# Patient Record
Sex: Female | Born: 1985 | Race: White | Hispanic: No | Marital: Single | Smoking: Current every day smoker
Health system: Southern US, Community
[De-identification: ages and names within clinical notes are randomized; demographics above are authoritative.]

## PROBLEM LIST (undated history)

## (undated) DIAGNOSIS — D689 Coagulation defect, unspecified: Secondary | ICD-10-CM

## (undated) DIAGNOSIS — H9311 Tinnitus, right ear: Secondary | ICD-10-CM

## (undated) DIAGNOSIS — E785 Hyperlipidemia, unspecified: Secondary | ICD-10-CM

## (undated) DIAGNOSIS — IMO0002 Reserved for concepts with insufficient information to code with codable children: Secondary | ICD-10-CM

## (undated) DIAGNOSIS — F419 Anxiety disorder, unspecified: Secondary | ICD-10-CM

## (undated) DIAGNOSIS — F32A Depression, unspecified: Secondary | ICD-10-CM

## (undated) DIAGNOSIS — K219 Gastro-esophageal reflux disease without esophagitis: Secondary | ICD-10-CM

## (undated) HISTORY — DX: Coagulation defect, unspecified: D68.9

## (undated) HISTORY — DX: Gastro-esophageal reflux disease without esophagitis: K21.9

## (undated) HISTORY — DX: Reserved for concepts with insufficient information to code with codable children: IMO0002

## (undated) HISTORY — DX: Hyperlipidemia, unspecified: E78.5

## (undated) HISTORY — DX: Depression, unspecified: F32.A

## (undated) HISTORY — PX: FRACTURE SURGERY: SHX138

## (undated) HISTORY — DX: Tinnitus, right ear: H93.11

## (undated) HISTORY — DX: Anxiety disorder, unspecified: F41.9

---

## 2009-10-14 ENCOUNTER — Emergency Department (HOSPITAL_COMMUNITY): Admission: EM | Admit: 2009-10-14 | Discharge: 2009-10-14 | Payer: Self-pay | Admitting: Emergency Medicine

## 2017-07-14 ENCOUNTER — Inpatient Hospital Stay (HOSPITAL_COMMUNITY)
Admission: EM | Admit: 2017-07-14 | Discharge: 2017-07-23 | DRG: 511 | Disposition: A | Discharge status: Skilled Nursing Facility | Payer: 59 | Attending: General Surgery | Admitting: General Surgery

## 2017-07-14 ENCOUNTER — Emergency Department (HOSPITAL_COMMUNITY): Payer: 59

## 2017-07-14 ENCOUNTER — Encounter (HOSPITAL_COMMUNITY): Payer: Self-pay | Admitting: Emergency Medicine

## 2017-07-14 DIAGNOSIS — S0181XA Laceration without foreign body of other part of head, initial encounter: Secondary | ICD-10-CM | POA: Diagnosis present

## 2017-07-14 DIAGNOSIS — S32451A Displaced transverse fracture of right acetabulum, initial encounter for closed fracture: Secondary | ICD-10-CM | POA: Diagnosis present

## 2017-07-14 DIAGNOSIS — S0121XA Laceration without foreign body of nose, initial encounter: Secondary | ICD-10-CM | POA: Diagnosis present

## 2017-07-14 DIAGNOSIS — S52502A Unspecified fracture of the lower end of left radius, initial encounter for closed fracture: Secondary | ICD-10-CM

## 2017-07-14 DIAGNOSIS — S52552A Other extraarticular fracture of lower end of left radius, initial encounter for closed fracture: Secondary | ICD-10-CM | POA: Diagnosis present

## 2017-07-14 DIAGNOSIS — S32811A Multiple fractures of pelvis with unstable disruption of pelvic ring, initial encounter for closed fracture: Secondary | ICD-10-CM | POA: Diagnosis present

## 2017-07-14 DIAGNOSIS — Z23 Encounter for immunization: Secondary | ICD-10-CM | POA: Diagnosis not present

## 2017-07-14 DIAGNOSIS — S329XXA Fracture of unspecified parts of lumbosacral spine and pelvis, initial encounter for closed fracture: Secondary | ICD-10-CM

## 2017-07-14 DIAGNOSIS — R339 Retention of urine, unspecified: Secondary | ICD-10-CM | POA: Diagnosis not present

## 2017-07-14 DIAGNOSIS — L259 Unspecified contact dermatitis, unspecified cause: Secondary | ICD-10-CM | POA: Diagnosis not present

## 2017-07-14 DIAGNOSIS — S63015A Dislocation of distal radioulnar joint of left wrist, initial encounter: Secondary | ICD-10-CM

## 2017-07-14 DIAGNOSIS — T148XXA Other injury of unspecified body region, initial encounter: Secondary | ICD-10-CM

## 2017-07-14 DIAGNOSIS — S62102A Fracture of unspecified carpal bone, left wrist, initial encounter for closed fracture: Secondary | ICD-10-CM

## 2017-07-14 DIAGNOSIS — S32599A Other specified fracture of unspecified pubis, initial encounter for closed fracture: Secondary | ICD-10-CM

## 2017-07-14 DIAGNOSIS — T1490XA Injury, unspecified, initial encounter: Secondary | ICD-10-CM

## 2017-07-14 HISTORY — DX: Multiple fractures of pelvis with unstable disruption of pelvic ring, initial encounter for closed fracture: S32.811A

## 2017-07-14 LAB — CBC
HCT: 41.7 % (ref 36.0–46.0)
Hemoglobin: 14 g/dL (ref 12.0–15.0)
MCH: 31.3 pg (ref 26.0–34.0)
MCHC: 33.6 g/dL (ref 30.0–36.0)
MCV: 93.1 fL (ref 78.0–100.0)
Platelets: 360 10*3/uL (ref 150–400)
RBC: 4.48 MIL/uL (ref 3.87–5.11)
RDW: 12.2 % (ref 11.5–15.5)
WBC: 16.8 10*3/uL — ABNORMAL HIGH (ref 4.0–10.5)

## 2017-07-14 LAB — BPAM FFP
BLOOD PRODUCT EXPIRATION DATE: 201905252359
Blood Product Expiration Date: 201905252359
ISSUE DATE / TIME: 201905211818
ISSUE DATE / TIME: 201905211818
Unit Type and Rh: 6200
Unit Type and Rh: 6200

## 2017-07-14 LAB — ABO/RH: ABO/RH(D): A POS

## 2017-07-14 LAB — I-STAT CHEM 8, ED
BUN: 16 mg/dL (ref 6–20)
CHLORIDE: 111 mmol/L (ref 101–111)
Calcium, Ion: 1.05 mmol/L — ABNORMAL LOW (ref 1.15–1.40)
Creatinine, Ser: 0.8 mg/dL (ref 0.44–1.00)
Glucose, Bld: 141 mg/dL — ABNORMAL HIGH (ref 65–99)
HEMATOCRIT: 42 % (ref 36.0–46.0)
Hemoglobin: 14.3 g/dL (ref 12.0–15.0)
Potassium: 3.4 mmol/L — ABNORMAL LOW (ref 3.5–5.1)
SODIUM: 142 mmol/L (ref 135–145)
TCO2: 17 mmol/L — ABNORMAL LOW (ref 22–32)

## 2017-07-14 LAB — LACTIC ACID, PLASMA: Lactic Acid, Venous: 1.6 mmol/L (ref 0.5–1.9)

## 2017-07-14 LAB — PREPARE FRESH FROZEN PLASMA: UNIT DIVISION: 0

## 2017-07-14 LAB — COMPREHENSIVE METABOLIC PANEL
ALBUMIN: 3.5 g/dL (ref 3.5–5.0)
ALK PHOS: 50 U/L (ref 38–126)
ALT: 26 U/L (ref 14–54)
AST: 27 U/L (ref 15–41)
Anion gap: 9 (ref 5–15)
BILIRUBIN TOTAL: 0.6 mg/dL (ref 0.3–1.2)
BUN: 14 mg/dL (ref 6–20)
CALCIUM: 8.1 mg/dL — AB (ref 8.9–10.3)
CO2: 16 mmol/L — ABNORMAL LOW (ref 22–32)
Chloride: 114 mmol/L — ABNORMAL HIGH (ref 101–111)
Creatinine, Ser: 1.03 mg/dL — ABNORMAL HIGH (ref 0.44–1.00)
GFR calc Af Amer: >60 mL/min (ref >60)
GFR calc non Af Amer: >60 mL/min (ref >60)
Glucose, Bld: 147 mg/dL — ABNORMAL HIGH (ref 65–99)
Potassium: 3.4 mmol/L — ABNORMAL LOW (ref 3.5–5.1)
Sodium: 139 mmol/L (ref 135–145)
TOTAL PROTEIN: 6 g/dL — AB (ref 6.5–8.1)

## 2017-07-14 LAB — I-STAT CG4 LACTIC ACID, ED: LACTIC ACID, VENOUS: 3.32 mmol/L — AB (ref 0.5–1.9)

## 2017-07-14 LAB — ETHANOL

## 2017-07-14 LAB — CDS SEROLOGY

## 2017-07-14 LAB — PROTIME-INR
INR: 1.11
Prothrombin Time: 14.2 seconds (ref 11.4–15.2)

## 2017-07-14 LAB — MRSA PCR SCREENING: MRSA by PCR: NEGATIVE

## 2017-07-14 MED ORDER — MORPHINE SULFATE (PF) 4 MG/ML IV SOLN
4.0000 mg | INTRAVENOUS | Status: DC | PRN
  Administered 2017-07-14 – 2017-07-15 (×6): 4 mg via INTRAVENOUS
  Filled 2017-07-14 (×6): qty 1

## 2017-07-14 MED ORDER — LIDOCAINE-EPINEPHRINE (PF) 2 %-1:200000 IJ SOLN
10.0000 mL | Freq: Once | INTRAMUSCULAR | Status: AC
  Administered 2017-07-14: 10 mL via INTRADERMAL
  Filled 2017-07-14: qty 20

## 2017-07-14 MED ORDER — MORPHINE SULFATE (PF) 4 MG/ML IV SOLN
2.0000 mg | INTRAVENOUS | Status: DC | PRN
  Administered 2017-07-15: 2 mg via INTRAVENOUS
  Filled 2017-07-14: qty 1

## 2017-07-14 MED ORDER — FENTANYL CITRATE (PF) 100 MCG/2ML IJ SOLN
50.0000 ug | Freq: Once | INTRAMUSCULAR | Status: AC
  Administered 2017-07-14: 50 ug via INTRAVENOUS

## 2017-07-14 MED ORDER — TETANUS-DIPHTH-ACELL PERTUSSIS 5-2.5-18.5 LF-MCG/0.5 IM SUSP
0.5000 mL | Freq: Once | INTRAMUSCULAR | Status: AC
  Administered 2017-07-14: 0.5 mL via INTRAMUSCULAR
  Filled 2017-07-14: qty 0.5

## 2017-07-14 MED ORDER — ONDANSETRON 4 MG PO TBDP: 4.0000 mg | ORAL_TABLET | Freq: Four times a day (QID) | ORAL | Status: DC | PRN

## 2017-07-14 MED ORDER — ONDANSETRON HCL 4 MG/2ML IJ SOLN: 4.0000 mg | Freq: Four times a day (QID) | INTRAMUSCULAR | Status: DC | PRN

## 2017-07-14 MED ORDER — POTASSIUM CHLORIDE IN NACL 20-0.9 MEQ/L-% IV SOLN
INTRAVENOUS | Status: DC
  Administered 2017-07-14 – 2017-07-16 (×4): via INTRAVENOUS
  Filled 2017-07-14 (×5): qty 1000

## 2017-07-14 MED ORDER — IOHEXOL 300 MG/ML  SOLN
100.0000 mL | Freq: Once | INTRAMUSCULAR | Status: AC | PRN
  Administered 2017-07-14: 100 mL via INTRAVENOUS

## 2017-07-14 MED ORDER — LIDOCAINE HCL (PF) 1 % IJ SOLN
INTRAMUSCULAR | Status: AC
Start: 2017-07-14 — End: 2017-07-15
  Filled 2017-07-14: qty 30

## 2017-07-14 MED ORDER — LIDOCAINE HCL (PF) 1 % IJ SOLN
INTRAMUSCULAR | Status: AC
  Filled 2017-07-14: qty 30

## 2017-07-14 MED ORDER — FENTANYL CITRATE (PF) 100 MCG/2ML IJ SOLN
50.0000 ug | Freq: Once | INTRAMUSCULAR | Status: AC
  Administered 2017-07-14: 50 ug via INTRAVENOUS
  Filled 2017-07-14: qty 2

## 2017-07-14 MED ORDER — FENTANYL CITRATE (PF) 100 MCG/2ML IJ SOLN
INTRAMUSCULAR | Status: AC
  Filled 2017-07-14: qty 2
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
## 2017-07-14 NOTE — Progress Notes (Signed)
   07/14/17 1800  Clinical Encounter Type  Visited With Patient;Health care provider  Visit Type ED  Referral From Nurse  Consult/Referral To Chaplain   Responded to a Level II that was upgraded to Level I.  EMT indicated patient's sister had been contacted and she was calling the patient's mother.  Patient being evaluated and then to CT.  Let the nurse first know family on the way.  Will follow and support. Chaplain Agustin Cree
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
## 2017-07-14 NOTE — ED Notes (Signed)
Patient transported to CT 

## 2017-07-15 ENCOUNTER — Inpatient Hospital Stay (HOSPITAL_COMMUNITY): Payer: 59 | Admitting: Certified Registered"

## 2017-07-15 ENCOUNTER — Inpatient Hospital Stay (HOSPITAL_COMMUNITY): Payer: 59

## 2017-07-15 ENCOUNTER — Encounter (HOSPITAL_COMMUNITY): Admission: EM | Disposition: A | Discharge status: Skilled Nursing Facility | Payer: Self-pay | Source: Home / Self Care

## 2017-07-15 ENCOUNTER — Encounter (HOSPITAL_COMMUNITY): Payer: Self-pay

## 2017-07-15 ENCOUNTER — Other Ambulatory Visit: Payer: Self-pay

## 2017-07-15 HISTORY — PX: ORIF PELVIC FRACTURE: SHX2128

## 2017-07-15 HISTORY — PX: ORIF WRIST FRACTURE: SHX2133

## 2017-07-15 LAB — COMPREHENSIVE METABOLIC PANEL
ALBUMIN: 3.5 g/dL (ref 3.5–5.0)
ALK PHOS: 48 U/L (ref 38–126)
ALT: 29 U/L (ref 14–54)
ANION GAP: 10 (ref 5–15)
AST: 28 U/L (ref 15–41)
BUN: 12 mg/dL (ref 6–20)
CHLORIDE: 112 mmol/L — AB (ref 101–111)
CO2: 19 mmol/L — ABNORMAL LOW (ref 22–32)
CREATININE: 0.78 mg/dL (ref 0.44–1.00)
Calcium: 8.2 mg/dL — ABNORMAL LOW (ref 8.9–10.3)
GFR calc Af Amer: >60 mL/min (ref >60)
GFR calc non Af Amer: >60 mL/min (ref >60)
Glucose, Bld: 107 mg/dL — ABNORMAL HIGH (ref 65–99)
Potassium: 3.8 mmol/L (ref 3.5–5.1)
SODIUM: 141 mmol/L (ref 135–145)
Total Bilirubin: 0.6 mg/dL (ref 0.3–1.2)
Total Protein: 5.9 g/dL — ABNORMAL LOW (ref 6.5–8.1)

## 2017-07-15 LAB — BPAM RBC
BLOOD PRODUCT EXPIRATION DATE: 201906022359
Blood Product Expiration Date: 201906012359
ISSUE DATE / TIME: 201905220752
ISSUE DATE / TIME: 201905220752
UNIT TYPE AND RH: 9500
Unit Type and Rh: 9500

## 2017-07-15 LAB — HIV ANTIBODY (ROUTINE TESTING W REFLEX): HIV SCREEN 4TH GENERATION: NONREACTIVE

## 2017-07-15 LAB — PREGNANCY, URINE: PREG TEST UR: NEGATIVE

## 2017-07-15 LAB — CBC
HCT: 40.3 % (ref 36.0–46.0)
HEMOGLOBIN: 13.4 g/dL (ref 12.0–15.0)
MCH: 31.2 pg (ref 26.0–34.0)
MCHC: 33.3 g/dL (ref 30.0–36.0)
MCV: 93.7 fL (ref 78.0–100.0)
Platelets: 312 10*3/uL (ref 150–400)
RBC: 4.3 MIL/uL (ref 3.87–5.11)
RDW: 12.6 % (ref 11.5–15.5)
WBC: 14.8 10*3/uL — ABNORMAL HIGH (ref 4.0–10.5)

## 2017-07-15 LAB — TYPE AND SCREEN
ABO/RH(D): A POS
ABO/RH(D): A POS
ANTIBODY SCREEN: NEGATIVE
ANTIBODY SCREEN: NEGATIVE
UNIT DIVISION: 0
Unit division: 0

## 2017-07-15 LAB — BLOOD PRODUCT ORDER (VERBAL) VERIFICATION

## 2017-07-15 SURGERY — OPEN REDUCTION INTERNAL FIXATION (ORIF) PELVIC FRACTURE
Anesthesia: General | Site: Wrist

## 2017-07-15 MED ORDER — DEXAMETHASONE SODIUM PHOSPHATE 10 MG/ML IJ SOLN
INTRAMUSCULAR | Status: AC
  Filled 2017-07-15: qty 1

## 2017-07-15 MED ORDER — LIDOCAINE 2% (20 MG/ML) 5 ML SYRINGE
INTRAMUSCULAR | Status: DC | PRN
  Administered 2017-07-15: 80 mg via INTRAVENOUS

## 2017-07-15 MED ORDER — PROPOFOL 10 MG/ML IV BOLUS
INTRAVENOUS | Status: AC
  Filled 2017-07-15: qty 20

## 2017-07-15 MED ORDER — OXYCODONE HCL 5 MG/5ML PO SOLN: 5.0000 mg | Freq: Once | ORAL | Status: AC | PRN

## 2017-07-15 MED ORDER — MIDAZOLAM HCL 5 MG/5ML IJ SOLN
INTRAMUSCULAR | Status: DC | PRN
  Administered 2017-07-15: 2 mg via INTRAVENOUS

## 2017-07-15 MED ORDER — OXYCODONE HCL 5 MG PO TABS
5.0000 mg | ORAL_TABLET | Freq: Once | ORAL | Status: AC | PRN
  Administered 2017-07-15: 5 mg via ORAL

## 2017-07-15 MED ORDER — LACTATED RINGERS IV SOLN
INTRAVENOUS | Status: DC
  Administered 2017-07-15 (×3): via INTRAVENOUS

## 2017-07-15 MED ORDER — ROCURONIUM BROMIDE 10 MG/ML (PF) SYRINGE
PREFILLED_SYRINGE | INTRAVENOUS | Status: AC
  Filled 2017-07-15: qty 5

## 2017-07-15 MED ORDER — BACITRACIN ZINC 500 UNIT/GM EX OINT
TOPICAL_OINTMENT | CUTANEOUS | Status: AC
  Filled 2017-07-15: qty 28.35

## 2017-07-15 MED ORDER — HYDROMORPHONE HCL 1 MG/ML IJ SOLN
INTRAMUSCULAR | Status: DC | PRN
  Administered 2017-07-15: 0.5 mg via INTRAVENOUS

## 2017-07-15 MED ORDER — TRANEXAMIC ACID 1000 MG/10ML IV SOLN
2000.0000 mg | INTRAVENOUS | Status: DC
  Filled 2017-07-15: qty 20

## 2017-07-15 MED ORDER — HYDROMORPHONE HCL 1 MG/ML IJ SOLN
1.0000 mg | INTRAMUSCULAR | Status: DC | PRN
  Administered 2017-07-15 – 2017-07-16 (×4): 1 mg via INTRAVENOUS
  Filled 2017-07-15 (×4): qty 1

## 2017-07-15 MED ORDER — VANCOMYCIN HCL 1000 MG IV SOLR
INTRAVENOUS | Status: AC
  Filled 2017-07-15: qty 2000

## 2017-07-15 MED ORDER — VANCOMYCIN HCL 1000 MG IV SOLR
INTRAVENOUS | Status: DC | PRN
Start: 2017-07-15 — End: 2017-07-15
  Administered 2017-07-15 (×2): 1000 mg

## 2017-07-15 MED ORDER — TOBRAMYCIN SULFATE 1.2 G IJ SOLR
INTRAMUSCULAR | Status: AC
  Filled 2017-07-15: qty 1.2

## 2017-07-15 MED ORDER — MIDAZOLAM HCL 2 MG/2ML IJ SOLN
INTRAMUSCULAR | Status: AC
  Filled 2017-07-15: qty 2

## 2017-07-15 MED ORDER — CEFAZOLIN SODIUM-DEXTROSE 2-4 GM/100ML-% IV SOLN
INTRAVENOUS | Status: AC
  Filled 2017-07-15: qty 100

## 2017-07-15 MED ORDER — MEPERIDINE HCL 50 MG/ML IJ SOLN: 6.2500 mg | INTRAMUSCULAR | Status: DC | PRN

## 2017-07-15 MED ORDER — CEFAZOLIN SODIUM-DEXTROSE 2-3 GM-%(50ML) IV SOLR
INTRAVENOUS | Status: DC | PRN
  Administered 2017-07-15 (×2): 2 g via INTRAVENOUS

## 2017-07-15 MED ORDER — SUGAMMADEX SODIUM 200 MG/2ML IV SOLN
INTRAVENOUS | Status: DC | PRN
  Administered 2017-07-15: 200 mg via INTRAVENOUS

## 2017-07-15 MED ORDER — 0.9 % SODIUM CHLORIDE (POUR BTL) OPTIME
TOPICAL | Status: DC | PRN
  Administered 2017-07-15 (×2): 1000 mL

## 2017-07-15 MED ORDER — PROMETHAZINE HCL 25 MG/ML IJ SOLN: 6.2500 mg | INTRAMUSCULAR | Status: DC | PRN

## 2017-07-15 MED ORDER — LIDOCAINE 2% (20 MG/ML) 5 ML SYRINGE
INTRAMUSCULAR | Status: AC
  Filled 2017-07-15: qty 5

## 2017-07-15 MED ORDER — OXYCODONE HCL 5 MG PO TABS
ORAL_TABLET | ORAL | Status: AC
  Filled 2017-07-15: qty 1

## 2017-07-15 MED ORDER — ONDANSETRON HCL 4 MG/2ML IJ SOLN
INTRAMUSCULAR | Status: DC | PRN
  Administered 2017-07-15: 4 mg via INTRAVENOUS

## 2017-07-15 MED ORDER — FENTANYL CITRATE (PF) 250 MCG/5ML IJ SOLN
INTRAMUSCULAR | Status: AC
  Filled 2017-07-15: qty 5

## 2017-07-15 MED ORDER — HYDROMORPHONE HCL 1 MG/ML IJ SOLN
INTRAMUSCULAR | Status: AC
  Filled 2017-07-15: qty 0.5

## 2017-07-15 MED ORDER — ROCURONIUM BROMIDE 10 MG/ML (PF) SYRINGE
PREFILLED_SYRINGE | INTRAVENOUS | Status: DC | PRN
  Administered 2017-07-15 (×2): 20 mg via INTRAVENOUS
  Administered 2017-07-15: 30 mg via INTRAVENOUS
  Administered 2017-07-15: 50 mg via INTRAVENOUS
  Administered 2017-07-15 (×2): 20 mg via INTRAVENOUS

## 2017-07-15 MED ORDER — HYDROMORPHONE HCL 2 MG/ML IJ SOLN
0.2500 mg | INTRAMUSCULAR | Status: DC | PRN
  Administered 2017-07-15: 0.3 mg via INTRAVENOUS
  Administered 2017-07-15: 0.5 mg via INTRAVENOUS

## 2017-07-15 MED ORDER — BACITRACIN ZINC 500 UNIT/GM EX OINT
TOPICAL_OINTMENT | CUTANEOUS | Status: DC | PRN
  Administered 2017-07-15: 2 via TOPICAL

## 2017-07-15 MED ORDER — FENTANYL CITRATE (PF) 100 MCG/2ML IJ SOLN
INTRAMUSCULAR | Status: DC | PRN
  Administered 2017-07-15: 100 ug via INTRAVENOUS
  Administered 2017-07-15 (×8): 50 ug via INTRAVENOUS

## 2017-07-15 MED ORDER — PROPOFOL 10 MG/ML IV BOLUS
INTRAVENOUS | Status: DC | PRN
  Administered 2017-07-15: 150 mg via INTRAVENOUS

## 2017-07-15 MED ORDER — CEFAZOLIN SODIUM-DEXTROSE 2-4 GM/100ML-% IV SOLN
2.0000 g | Freq: Three times a day (TID) | INTRAVENOUS | Status: AC
  Administered 2017-07-15 – 2017-07-16 (×3): 2 g via INTRAVENOUS
  Filled 2017-07-15 (×4): qty 100

## 2017-07-15 MED ORDER — TOBRAMYCIN SULFATE 1.2 G IJ SOLR
INTRAMUSCULAR | Status: DC | PRN
  Administered 2017-07-15: 1.2 g

## 2017-07-15 MED ORDER — DEXAMETHASONE SODIUM PHOSPHATE 10 MG/ML IJ SOLN
INTRAMUSCULAR | Status: DC | PRN
  Administered 2017-07-15: 10 mg via INTRAVENOUS

## 2017-07-15 MED ORDER — HYDROMORPHONE HCL 2 MG/ML IJ SOLN
INTRAMUSCULAR | Status: AC
  Filled 2017-07-15: qty 1

## 2017-07-15 MED ORDER — METHOCARBAMOL 1000 MG/10ML IJ SOLN
500.0000 mg | Freq: Three times a day (TID) | INTRAVENOUS | Status: DC | PRN
  Filled 2017-07-15 (×2): qty 5

## 2017-07-15 MED ORDER — TOBRAMYCIN SULFATE 80 MG/2ML IJ SOLN
INTRAMUSCULAR | Status: AC
  Filled 2017-07-15: qty 2

## 2017-07-15 MED ORDER — OXYCODONE HCL 5 MG PO TABS
5.0000 mg | ORAL_TABLET | Freq: Four times a day (QID) | ORAL | Status: DC | PRN
  Administered 2017-07-15 – 2017-07-16 (×2): 5 mg via ORAL
  Filled 2017-07-15 (×2): qty 1

## 2017-07-15 SURGICAL SUPPLY — 104 items
BANDAGE ACE 4X5 VEL STRL LF (GAUZE/BANDAGES/DRESSINGS) ×4 IMPLANT
BIT DRILL CANN 4.5MM (BIT) ×4 IMPLANT
BLADE CLIPPER SURG (BLADE) ×4 IMPLANT
BNDG COHESIVE 4X5 TAN STRL (GAUZE/BANDAGES/DRESSINGS) ×4 IMPLANT
BNDG ESMARK 4X9 LF (GAUZE/BANDAGES/DRESSINGS) ×8 IMPLANT
BRUSH SCRUB SURG 4.25 DISP (MISCELLANEOUS) ×8 IMPLANT
CHLORAPREP W/TINT 26ML (MISCELLANEOUS) ×8 IMPLANT
COVER SURGICAL LIGHT HANDLE (MISCELLANEOUS) ×4 IMPLANT
CUFF TOURNIQUET SINGLE 18IN (TOURNIQUET CUFF) ×4 IMPLANT
DECANTER SPIKE VIAL GLASS SM (MISCELLANEOUS) IMPLANT
DERMABOND ADHESIVE PROPEN (GAUZE/BANDAGES/DRESSINGS) ×2
DERMABOND ADVANCED (GAUZE/BANDAGES/DRESSINGS) ×4
DERMABOND ADVANCED .7 DNX12 (GAUZE/BANDAGES/DRESSINGS) ×4 IMPLANT
DERMABOND ADVANCED .7 DNX6 (GAUZE/BANDAGES/DRESSINGS) ×2 IMPLANT
DISTAL RADIUS HEAD 6H 3HOLE SH (Orthopedic Implant) ×4 IMPLANT
DRAIN CHANNEL 15F RND FF W/TCR (WOUND CARE) IMPLANT
DRAPE C-ARM 42X72 X-RAY (DRAPES) ×8 IMPLANT
DRAPE C-ARMOR (DRAPES) ×4 IMPLANT
DRAPE HALF SHEET 40X57 (DRAPES) ×4 IMPLANT
DRAPE IMP U-DRAPE 54X76 (DRAPES) ×4 IMPLANT
DRAPE INCISE IOBAN 66X45 STRL (DRAPES) ×12 IMPLANT
DRAPE ORTHO SPLIT 77X108 STRL (DRAPES) ×2
DRAPE PROXIMA HALF (DRAPES) ×8 IMPLANT
DRAPE SURG 17X23 STRL (DRAPES) ×24 IMPLANT
DRAPE SURG ORHT 6 SPLT 77X108 (DRAPES) ×2 IMPLANT
DRAPE U-SHAPE 47X51 STRL (DRAPES) ×4 IMPLANT
DRAPE UNIVERSAL PACK (DRAPES) ×4 IMPLANT
DRILL BIT CANN 4.5MM (BIT) ×4
DRSG EMULSION OIL 3X3 NADH (GAUZE/BANDAGES/DRESSINGS) ×4 IMPLANT
DRSG MEPILEX BORDER 4X4 (GAUZE/BANDAGES/DRESSINGS) ×12 IMPLANT
DRSG MEPILEX BORDER 4X8 (GAUZE/BANDAGES/DRESSINGS) ×4 IMPLANT
DRSG PAD ABDOMINAL 8X10 ST (GAUZE/BANDAGES/DRESSINGS) ×4 IMPLANT
ELECT PENCIL ROCKER SW 15FT (MISCELLANEOUS) ×4 IMPLANT
ELECT REM PT RETURN 9FT ADLT (ELECTROSURGICAL) ×4
ELECTRODE REM PT RTRN 9FT ADLT (ELECTROSURGICAL) ×2 IMPLANT
EVACUATOR SILICONE 100CC (DRAIN) IMPLANT
GAUZE SPONGE 4X4 12PLY STRL (GAUZE/BANDAGES/DRESSINGS) ×4 IMPLANT
GLOVE BIO SURGEON STRL SZ7.5 (GLOVE) ×16 IMPLANT
GLOVE BIO SURGEON STRL SZ8 (GLOVE) ×4 IMPLANT
GLOVE BIOGEL PI IND STRL 7.5 (GLOVE) ×2 IMPLANT
GLOVE BIOGEL PI IND STRL 8 (GLOVE) ×2 IMPLANT
GLOVE BIOGEL PI INDICATOR 7.5 (GLOVE) ×2
GLOVE BIOGEL PI INDICATOR 8 (GLOVE) ×2
GOWN STRL REUS W/ TWL LRG LVL3 (GOWN DISPOSABLE) ×4 IMPLANT
GOWN STRL REUS W/ TWL XL LVL3 (GOWN DISPOSABLE) ×2 IMPLANT
GOWN STRL REUS W/TWL LRG LVL3 (GOWN DISPOSABLE) ×4
GOWN STRL REUS W/TWL XL LVL3 (GOWN DISPOSABLE) ×2
GUIDEWIRE 2.0MM (WIRE) ×16 IMPLANT
GUIDEWIRE THREADED 2.8MM (WIRE) ×12 IMPLANT
HEAD RADIUS DISTAL 3HOLE SH 6H (Orthopedic Implant) ×2 IMPLANT
KIT BASIN OR (CUSTOM PROCEDURE TRAY) ×4 IMPLANT
KIT TURNOVER KIT B (KITS) ×4 IMPLANT
MANIFOLD NEPTUNE II (INSTRUMENTS) ×4 IMPLANT
NEEDLE HYPO 25GX1X1/2 BEV (NEEDLE) IMPLANT
NS IRRIG 1000ML POUR BTL (IV SOLUTION) ×8 IMPLANT
PACK ORTHO EXTREMITY (CUSTOM PROCEDURE TRAY) ×4 IMPLANT
PACK TOTAL JOINT (CUSTOM PROCEDURE TRAY) ×4 IMPLANT
PAD ARMBOARD 7.5X6 YLW CONV (MISCELLANEOUS) ×8 IMPLANT
PAD CAST 3X4 CTTN HI CHSV (CAST SUPPLIES) ×2 IMPLANT
PAD CAST 4YDX4 CTTN HI CHSV (CAST SUPPLIES) ×2 IMPLANT
PADDING CAST COTTON 3X4 STRL (CAST SUPPLIES) ×2
PADDING CAST COTTON 4X4 STRL (CAST SUPPLIES) ×2
PLATE PUBLIC SYMPHOSIS 3.5 (Plate) ×4 IMPLANT
SCREW  LOCK CANN 6.5X130 (Screw) ×2 IMPLANT
SCREW BONE CANN 7.3X75 HEX (Screw) ×4 IMPLANT
SCREW CANN 6.5X90 (Screw) ×4 IMPLANT
SCREW CANN 6.5X90 32MM THD (Screw) ×2 IMPLANT
SCREW CORTEX 2.4X18 (Screw) ×4 IMPLANT
SCREW CORTEX 2.7X14MM (Screw) ×4 IMPLANT
SCREW CORTEX 3.5 18MM (Screw) ×2 IMPLANT
SCREW CORTEX 3.5 22MM (Screw) ×4 IMPLANT
SCREW CORTEX 3.5 30MM (Screw) ×2 IMPLANT
SCREW CORTEX 3.5X45MM (Screw) ×4 IMPLANT
SCREW CORTEX 3.5X50MM (Screw) ×2 IMPLANT
SCREW LOCK CANN 6.5X130 (Screw) ×2 IMPLANT
SCREW LOCK CORT ST 3.5X18 (Screw) ×2 IMPLANT
SCREW LOCK CORT ST 3.5X22 (Screw) ×4 IMPLANT
SCREW LOCK CORT ST 3.5X30 (Screw) ×2 IMPLANT
SCREW PELVIC CORT ST 3.5X50 (Screw) ×2 IMPLANT
SCREW SELF TAP 12M (Screw) ×8 IMPLANT
SCREW VA LOCKING 2.4X16 (Screw) ×8 IMPLANT
SCREW VA LOCKING 2.4X18 (Screw) ×12 IMPLANT
SLING ARM FOAM STRAP LRG (SOFTGOODS) ×4 IMPLANT
SPONGE LAP 18X18 X RAY DECT (DISPOSABLE) ×4 IMPLANT
STAPLER VISISTAT 35W (STAPLE) ×4 IMPLANT
SUCTION FRAZIER HANDLE 10FR (MISCELLANEOUS) ×2
SUCTION TUBE FRAZIER 10FR DISP (MISCELLANEOUS) ×2 IMPLANT
SUT ETHILON 3 0 PS 1 (SUTURE) ×12 IMPLANT
SUT MNCRL AB 3-0 PS2 18 (SUTURE) ×4 IMPLANT
SUT MON AB 2-0 CT1 36 (SUTURE) ×4 IMPLANT
SUT VIC AB 0 CT1 27 (SUTURE) ×4
SUT VIC AB 0 CT1 27XBRD ANBCTR (SUTURE) ×4 IMPLANT
SUT VIC AB 1 CT1 18XCR BRD 8 (SUTURE) IMPLANT
SUT VIC AB 1 CT1 8-18 (SUTURE)
SUT VIC AB 2-0 CT1 27 (SUTURE) ×6
SUT VIC AB 2-0 CT1 TAPERPNT 27 (SUTURE) ×6 IMPLANT
SYR CONTROL 10ML LL (SYRINGE) IMPLANT
TOWEL OR 17X24 6PK STRL BLUE (TOWEL DISPOSABLE) IMPLANT
TOWEL OR 17X26 10 PK STRL BLUE (TOWEL DISPOSABLE) ×4 IMPLANT
TUBE CONNECTING 12'X1/4 (SUCTIONS) ×2
TUBE CONNECTING 12X1/4 (SUCTIONS) ×6 IMPLANT
UNDERPAD 30X30 (UNDERPADS AND DIAPERS) ×4 IMPLANT
WASHER FOR 5.0 SCREWS (Washer) ×8 IMPLANT
WATER STERILE IRR 1000ML POUR (IV SOLUTION) ×16 IMPLANT

## 2017-07-15 NOTE — Anesthesia Preprocedure Evaluation (Signed)
Anesthesia Evaluation  Patient identified by MRN, date of birth, ID band Patient awake    Reviewed: Allergy & Precautions, NPO status , Patient's Chart, lab work & pertinent test results  Airway Mallampati: II  TM Distance: >3 FB Neck ROM: Full    Dental no notable dental hx.    Pulmonary neg pulmonary ROS,    Pulmonary exam normal breath sounds clear to auscultation       Cardiovascular negative cardio ROS Normal cardiovascular exam Rhythm:Regular Rate:Normal     Neuro/Psych negative neurological ROS  negative psych ROS   GI/Hepatic negative GI ROS, Neg liver ROS,   Endo/Other  negative endocrine ROS  Renal/GU negative Renal ROS  negative genitourinary   Musculoskeletal negative musculoskeletal ROS (+)   Abdominal (+) + obese,   Peds negative pediatric ROS (+)  Hematology negative hematology ROS (+)   Anesthesia Other Findings   Reproductive/Obstetrics negative OB ROS                             Anesthesia Physical Anesthesia Plan  ASA: II  Anesthesia Plan: General   Post-op Pain Management:    Induction: Intravenous  PONV Risk Score and Plan: 3 and Ondansetron, Dexamethasone and Midazolam  Airway Management Planned: Oral ETT  Additional Equipment:   Intra-op Plan:   Post-operative Plan: Extubation in OR  Informed Consent: I have reviewed the patients History and Physical, chart, labs and discussed the procedure including the risks, benefits and alternatives for the proposed anesthesia with the patient or authorized representative who has indicated his/her understanding and acceptance.   Dental advisory given  Plan Discussed with: CRNA  Anesthesia Plan Comments:         Anesthesia Quick Evaluation  

## 2017-07-15 NOTE — Anesthesia Postprocedure Evaluation (Signed)
Anesthesia Post Note  Patient: Lynn Scott  Procedure(s) Performed: OPEN REDUCTION INTERNAL FIXATION (ORIF) pubic symphysis SI screw (N/A Pelvis) OPEN REDUCTION INTERNAL FIXATION (ORIF) WRIST FRACTURE (Left Wrist)     Patient location during evaluation: PACU Anesthesia Type: General Level of consciousness: awake and alert Pain management: pain level controlled Vital Signs Assessment: post-procedure vital signs reviewed and stable Respiratory status: spontaneous breathing, nonlabored ventilation, respiratory function stable and patient connected to nasal cannula oxygen Cardiovascular status: blood pressure returned to baseline and stable Postop Assessment: no apparent nausea or vomiting Anesthetic complications: no    Last Vitals:  Vitals:   07/15/17 1817 07/15/17 1830  BP: (!) 128/91 (!) 142/86  Pulse: 69 77  Resp: 19 (!) 42  Temp: 36.6 C 37.2 C  SpO2: 96% 96%    Last Pain:  Vitals:   07/15/17 1830  TempSrc: Oral  PainSc:                  Kay Ricciuti COKER
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
## 2017-07-15 NOTE — Progress Notes (Signed)
Discussed case with Dr. Carola Frost will plan to proceed with with ORIF of pelvis and discussed with Dr. Amanda Pea and will proceed with ORIF of distal radius as well. Risks and benefits discussed with patient. Risks discussed included bleeding requiring blood transfusion, bleeding causing a hematoma, infection, malunion, nonunion, damage to surrounding nerves and blood vessels, pain, hardware prominence or irritation, hardware failure, stiffness, post-traumatic arthritis, DVT/PE, compartment syndrome, and even death. Consent was obtained.  Roby Lofts, MD [facility] [phone] (phone)

## 2017-07-15 NOTE — Progress Notes (Signed)
[facility] ([facility])    Procedure(s) (LRB): OPEN REDUCTION INTERNAL FIXATION (ORIF) pubic symphysis SI screw (N/A) OPEN REDUCTION INTERNAL FIXATION (ORIF) WRIST FRACTURE (Left)  Subjective: Patient reports pain as moderate and and denies numbness or tingling in left hand and both feet.    Objective: Current Vitals Blood pressure (!) 141/77, pulse 64, temperature 99 F (37.2 C), temperature source Oral, resp. rate 18, height  (1.676 m), weight 90.7 kg (200 lb), SpO2 97 %. Vital signs in last 24 hours: Temp:  [97.8 F (36.6 C)-99 F (37.2 C)] 99 F (37.2 C) (05/22 0450) Pulse Rate:  [52-88] 64 (05/22 0600) Resp:  [15-27] 18 (05/22 0600) BP: (91-141)/(48-90) 141/77 (05/22 0600) SpO2:  [96 %-100 %] 97 % (05/22 0600) Weight:  [90.7 kg (200 lb)] 90.7 kg (200 lb) (05/21 2045)  Intake/Output from previous day: 05/21 0701 - 05/22 0700 In: 3500 [I.V.:3500] Out: 800 [Urine:800]  LABS Recent Labs    07/14/17 1832 07/14/17 1837 07/15/17 0159  HGB 14.0 14.3 13.4   Recent Labs    07/14/17 1832 07/14/17 1837 07/15/17 0159  WBC 16.8*  --  14.8*  RBC 4.48  --  4.30  HCT 41.7 42.0 40.3  PLT 360  --  312   Recent Labs    07/14/17 1832 07/14/17 1837 07/15/17 0159  NA 139 142 141  K 3.4* 3.4* 3.8  CL 114* 111 112*  CO2 16*  --  19*  BUN CREATININE 1.03* 0.80 0.78  GLUCOSE 147* 141* 107*  CALCIUM 8.1*  --  8.2*   Recent Labs    07/14/17 1832  INR 1.11     Physical Exam LUE Splint in place  Sens  Ax/R/M/U intact without paresthesia  Mot   Ax/ R/ PIN/ M/ AIN/ U intact  Brisk CR Binder in place R&LLE    No pain with axial loading of LLE  Edema/ swelling controlled  Sens: DPN, SPN, TN intact  Motor: EHL, FHL, and lessor toe ext and flex all intact grossly  DP 2+, Brisk cap refill, warm to touch  Assessment/Plan: Lactate has normalized No carpal tunnel symptoms  Today's planned Procedures with Dr. Jena Gauss (and Gramig):  OPEN  REDUCTION INTERNAL FIXATION (ORIF) pubic symphysis SI screw fixation  OPEN REDUCTION INTERNAL FIXATION (ORIF) WRIST FRACTURE (Left)  Myrene Galas, MD [facility], [facility] [phone]

## 2017-07-15 NOTE — Transfer of Care (Signed)
Immediate Anesthesia Transfer of Care Note  Patient: Lynn Scott  Procedure(s) Performed: OPEN REDUCTION INTERNAL FIXATION (ORIF) pubic symphysis SI screw (N/A Pelvis) OPEN REDUCTION INTERNAL FIXATION (ORIF) WRIST FRACTURE (Left Wrist)  Patient Location: PACU  Anesthesia Type:General  Level of Consciousness: awake, alert  and oriented  Airway & Oxygen Therapy: Patient Spontanous Breathing  Post-op Assessment: Report given to RN and Post -op Vital signs reviewed and stable  Post vital signs: Reviewed and stable  Last Vitals:  Vitals Value Taken Time  BP 127/92 07/15/2017  4:46 PM  Temp    Pulse 100 07/15/2017  4:47 PM  Resp 26 07/15/2017  4:47 PM  SpO2 96 % 07/15/2017  4:47 PM  Vitals shown include unvalidated device data.  Last Pain:  Vitals:   07/15/17 0831  TempSrc: Oral  PainSc:          Complications: No apparent anesthesia complications

## 2017-07-15 NOTE — Anesthesia Procedure Notes (Signed)
Procedure Name: Intubation Date/Time: 07/15/2017 12:13 PM Performed by: Freddie Breech, CRNA Pre-anesthesia Checklist: Timeout performed, Patient being monitored, Suction available, Emergency Drugs available and Patient identified Patient Re-evaluated:Patient Re-evaluated prior to induction Oxygen Delivery Method: Circle system utilized Preoxygenation: Pre-oxygenation with 100% oxygen Induction Type: IV induction Ventilation: Mask ventilation without difficulty and Oral airway inserted - appropriate to patient size Laryngoscope Size: Mac and 3 Grade View: Grade I Tube type: Oral Tube size: 7.0 mm Number of attempts: 1 Placement Confirmation: breath sounds checked- equal and bilateral,  positive ETCO2 and ETT inserted through vocal cords under direct vision Secured at: 21 cm Tube secured with: Tape Dental Injury: Teeth and Oropharynx as per pre-operative assessment

## 2017-07-15 NOTE — Progress Notes (Signed)
Notified Dr Sheliah Hatch regarding patient uncontrolled pain. Orders received.
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
## 2017-07-16 LAB — BASIC METABOLIC PANEL
Anion gap: 9 (ref 5–15)
BUN: 5 mg/dL — AB (ref 6–20)
CALCIUM: 8.2 mg/dL — AB (ref 8.9–10.3)
CO2: 22 mmol/L (ref 22–32)
CREATININE: 0.67 mg/dL (ref 0.44–1.00)
Chloride: 107 mmol/L (ref 101–111)
GFR calc Af Amer: >60 mL/min (ref >60)
Glucose, Bld: 116 mg/dL — ABNORMAL HIGH (ref 65–99)
Potassium: 4.5 mmol/L (ref 3.5–5.1)
Sodium: 138 mmol/L (ref 135–145)

## 2017-07-16 LAB — CBC
HCT: 37.4 % (ref 36.0–46.0)
Hemoglobin: 12.2 g/dL (ref 12.0–15.0)
MCH: 30.7 pg (ref 26.0–34.0)
MCHC: 32.6 g/dL (ref 30.0–36.0)
MCV: 94 fL (ref 78.0–100.0)
PLATELETS: 266 10*3/uL (ref 150–400)
RBC: 3.98 MIL/uL (ref 3.87–5.11)
RDW: 12.4 % (ref 11.5–15.5)
WBC: 10.4 10*3/uL (ref 4.0–10.5)

## 2017-07-16 MED ORDER — ACETAMINOPHEN 325 MG PO TABS
650.0000 mg | ORAL_TABLET | Freq: Four times a day (QID) | ORAL | Status: DC
  Administered 2017-07-16 – 2017-07-23 (×27): 650 mg via ORAL
  Filled 2017-07-16 (×27): qty 2

## 2017-07-16 MED ORDER — OXYCODONE HCL 5 MG PO TABS
10.0000 mg | ORAL_TABLET | ORAL | Status: DC | PRN
Start: 2017-07-16 — End: 2017-07-16

## 2017-07-16 MED ORDER — ENOXAPARIN SODIUM 40 MG/0.4ML ~~LOC~~ SOLN
40.0000 mg | SUBCUTANEOUS | Status: DC
  Administered 2017-07-16 – 2017-07-23 (×8): 40 mg via SUBCUTANEOUS
  Filled 2017-07-16 (×9): qty 0.4

## 2017-07-16 MED ORDER — OXYCODONE HCL 5 MG PO TABS
5.0000 mg | ORAL_TABLET | ORAL | Status: DC | PRN
  Administered 2017-07-16 (×3): 10 mg via ORAL
  Administered 2017-07-17: 5 mg via ORAL
  Administered 2017-07-17 – 2017-07-23 (×8): 10 mg via ORAL
  Filled 2017-07-16: qty 1
  Filled 2017-07-16 (×13): qty 2

## 2017-07-16 MED ORDER — METHOCARBAMOL 500 MG PO TABS
1000.0000 mg | ORAL_TABLET | Freq: Three times a day (TID) | ORAL | Status: DC
  Administered 2017-07-16 – 2017-07-23 (×21): 1000 mg via ORAL
  Filled 2017-07-16 (×22): qty 2

## 2017-07-16 MED ORDER — DOCUSATE SODIUM 100 MG PO CAPS
100.0000 mg | ORAL_CAPSULE | Freq: Two times a day (BID) | ORAL | Status: DC
  Administered 2017-07-16 – 2017-07-23 (×15): 100 mg via ORAL
  Filled 2017-07-16 (×15): qty 1

## 2017-07-16 MED ORDER — OXYCODONE HCL 5 MG PO TABS: 5.0000 mg | ORAL_TABLET | ORAL | Status: DC | PRN

## 2017-07-16 MED ORDER — HYDROMORPHONE HCL 1 MG/ML IJ SOLN
1.0000 mg | INTRAMUSCULAR | Status: DC | PRN
  Administered 2017-07-16 – 2017-07-23 (×9): 1 mg via INTRAVENOUS
  Filled 2017-07-16 (×9): qty 1

## 2017-07-16 MED ORDER — POLYETHYLENE GLYCOL 3350 17 G PO PACK
17.0000 g | PACK | Freq: Every day | ORAL | Status: DC
  Administered 2017-07-16 – 2017-07-23 (×8): 17 g via ORAL
  Filled 2017-07-16 (×8): qty 1

## 2017-07-16 MED ORDER — METHOCARBAMOL 500 MG PO TABS: 500.0000 mg | ORAL_TABLET | Freq: Three times a day (TID) | ORAL | Status: DC

## 2017-07-16 MED ORDER — BETHANECHOL CHLORIDE 10 MG PO TABS
10.0000 mg | ORAL_TABLET | Freq: Three times a day (TID) | ORAL | Status: DC
Start: 2017-07-16 — End: 2017-07-23
  Administered 2017-07-16 – 2017-07-23 (×22): 10 mg via ORAL
  Filled 2017-07-16 (×22): qty 1

## 2017-07-16 NOTE — Progress Notes (Signed)
Inpatient Rehabilitation  Per PT/OT request, patient was screened by Zanaria Morell for appropriateness for an Inpatient Acute Rehab consult.  At this time we are recommending an Inpatient Rehab consult.  Please order if you are agreeable.    Jaece Ducharme, M.A., CCC/SLP Admission Coordinator  [facility]  Cell [phone]  
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
## 2017-07-17 ENCOUNTER — Encounter (HOSPITAL_COMMUNITY): Payer: Self-pay | Admitting: Student

## 2017-07-17 LAB — BASIC METABOLIC PANEL
Anion gap: 8 (ref 5–15)
BUN: 6 mg/dL (ref 6–20)
CALCIUM: 8 mg/dL — AB (ref 8.9–10.3)
CHLORIDE: 105 mmol/L (ref 101–111)
CO2: 23 mmol/L (ref 22–32)
CREATININE: 0.73 mg/dL (ref 0.44–1.00)
GFR calc non Af Amer: >60 mL/min (ref >60)
Glucose, Bld: 97 mg/dL (ref 65–99)
Potassium: 4 mmol/L (ref 3.5–5.1)
Sodium: 136 mmol/L (ref 135–145)

## 2017-07-17 LAB — CBC
HEMATOCRIT: 36.8 % (ref 36.0–46.0)
HEMOGLOBIN: 12.2 g/dL (ref 12.0–15.0)
MCH: 30.5 pg (ref 26.0–34.0)
MCHC: 33.2 g/dL (ref 30.0–36.0)
MCV: 92 fL (ref 78.0–100.0)
Platelets: 223 10*3/uL (ref 150–400)
RBC: 4 MIL/uL (ref 3.87–5.11)
RDW: 12.4 % (ref 11.5–15.5)
WBC: 8.2 10*3/uL (ref 4.0–10.5)

## 2017-07-17 MED ORDER — TRAMADOL HCL 50 MG PO TABS
50.0000 mg | ORAL_TABLET | Freq: Four times a day (QID) | ORAL | Status: DC
  Administered 2017-07-17 – 2017-07-23 (×18): 50 mg via ORAL
  Filled 2017-07-17 (×20): qty 1

## 2017-07-17 MED ORDER — BOOST / RESOURCE BREEZE PO LIQD CUSTOM
1.0000 | Freq: Three times a day (TID) | ORAL | Status: DC
Start: 2017-07-17 — End: 2017-07-23
  Administered 2017-07-17 – 2017-07-18 (×2): 1 via ORAL
  Administered 2017-07-18: 11:00:00 via ORAL
  Administered 2017-07-19 – 2017-07-20 (×2): 1 via ORAL
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
## 2017-07-17 NOTE — Progress Notes (Signed)
CSW completed SBIRT with patient. Also noted PT recommendation for CIR. Will be available for disposition planning if needed.  Abigail Butts, LCSWA [phone]
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
## 2017-07-17 NOTE — Progress Notes (Signed)
Inpatient Rehabilitation Admissions Coordinator  Patient seen by Rehab PA on 5/23, Delle Reining. Rehab consult deferred. Please refer to her note. I contacted Abigail Butts, SW, that SNF rehab is needed at this time.   Ottie Glazier, RN, MSN Rehab Admissions Coordinator [phone] 07/17/2017 4:22 PM

## 2017-07-18 MED ORDER — TAMSULOSIN HCL 0.4 MG PO CAPS
0.4000 mg | ORAL_CAPSULE | Freq: Every day | ORAL | Status: DC
  Administered 2017-07-18 – 2017-07-23 (×6): 0.4 mg via ORAL
  Filled 2017-07-18 (×6): qty 1
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
## 2017-07-19 MED ORDER — SIMETHICONE 80 MG PO CHEW
80.0000 mg | CHEWABLE_TABLET | Freq: Four times a day (QID) | ORAL | Status: DC | PRN
  Administered 2017-07-19: 80 mg via ORAL
  Filled 2017-07-19: qty 1
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
## 2017-07-21 DIAGNOSIS — S52502A Unspecified fracture of the lower end of left radius, initial encounter for closed fracture: Secondary | ICD-10-CM

## 2017-07-21 DIAGNOSIS — S63015A Dislocation of distal radioulnar joint of left wrist, initial encounter: Secondary | ICD-10-CM

## 2017-07-21 DIAGNOSIS — S32451A Displaced transverse fracture of right acetabulum, initial encounter for closed fracture: Secondary | ICD-10-CM

## 2017-07-21 DIAGNOSIS — S32599A Other specified fracture of unspecified pubis, initial encounter for closed fracture: Secondary | ICD-10-CM

## 2017-07-21 HISTORY — DX: Dislocation of distal radioulnar joint of left wrist, initial encounter: S63.015A

## 2017-07-21 HISTORY — DX: Displaced transverse fracture of right acetabulum, initial encounter for closed fracture: S32.451A

## 2017-07-21 HISTORY — DX: Unspecified fracture of the lower end of left radius, initial encounter for closed fracture: S52.502A

## 2017-07-21 MED ORDER — HYDROCORTISONE 1 % EX CREA
TOPICAL_CREAM | Freq: Two times a day (BID) | CUTANEOUS | Status: DC | PRN
  Filled 2017-07-21: qty 28

## 2017-07-21 MED ORDER — DIPHENHYDRAMINE HCL 25 MG PO CAPS
25.0000 mg | ORAL_CAPSULE | Freq: Four times a day (QID) | ORAL | Status: DC | PRN
  Administered 2017-07-21 – 2017-07-22 (×2): 25 mg via ORAL
  Filled 2017-07-21 (×2): qty 1

## 2017-07-21 NOTE — Social Work (Addendum)
CSW acknowledging consult for SNF, pt family requesting SNF instead of CIR for longer therapeutic support. FL2 completed, faxed out.  Pt will need bed offers and insurance authorization prior to discharge to SNF.   CSW will continue to follow.  Doy Hutching, LCSWA [facility] Clinical Social Work [phone]

## 2017-07-21 NOTE — NC FL2 (Signed)
[place] MEDICAID FL2 LEVEL OF CARE SCREENING TOOL     IDENTIFICATION  Patient Name: Lynn Scott Birthdate: 1985-08-08 Sex: female Admission Date (Current Location): 07/14/2017  [facility] and [place] Number:  [employer] and Address:  [facility], [address] [place], [place] [address]      Provider Number: [id number]  Attending Physician Name and Address:  Md, Trauma, MD  Relative Name and Phone Number:       Current Level of Care: Hospital Recommended Level of Care: Skilled Nursing Facility Prior Approval Number:    Date Approved/Denied:   PASRR Number: [id number]  Discharge Plan: SNF    Current Diagnoses: Patient Active Problem List   Diagnosis Date Noted  . Closed pelvic fracture (HCC) 07/14/2017    Orientation RESPIRATION BLADDER Height & Weight     Self, Time, Situation, Place  Normal Continent, Indwelling catheter(foley for retention) Weight: 200 lb (90.7 kg) Height:   (167.6 cm)  BEHAVIORAL SYMPTOMS/MOOD NEUROLOGICAL BOWEL NUTRITION STATUS      Continent Diet(see discharge summary)  AMBULATORY STATUS COMMUNICATION OF NEEDS Skin   Extensive Assist Verbally Surgical wounds, Skin abrasions, Other (Comment)(incision on groin with silicone dressing; incision on left wrist with compression wrap; stitches and steri strips on left upper eye incision; stiches on nose with no dressing; wounds on left chest and abdomen with no dressings)                       Personal Care Assistance Level of Assistance  Bathing, Feeding, Dressing Bathing Assistance: Maximum assistance Feeding assistance: Independent Dressing Assistance: Maximum assistance     Functional Limitations Info  Hearing, Sight, Speech Sight Info: Adequate Hearing Info: Adequate Speech Info: Adequate    SPECIAL CARE FACTORS FREQUENCY  PT (By licensed PT), OT (By licensed OT)     PT Frequency: 5x week OT Frequency: 5x week             Contractures Contractures Info: Not present    Additional Factors Info  Code Status, Allergies Code Status Info: Full Code Allergies Info: No Known Allergies           Current Medications (07/21/2017):  This is the current hospital active medication list Current Facility-Administered Medications  Medication Dose Route Frequency Provider Last Rate Last Dose  . acetaminophen (TYLENOL) tablet 650 mg  650 mg Oral Q6H Focht, Jessica L, PA   650 mg at 07/21/17 0953  . bethanechol (URECHOLINE) tablet 10 mg  10 mg Oral TID Meuth, Brooke A, PA-C   10 mg at 07/21/17 0953  . diphenhydrAMINE (BENADRYL) capsule 25 mg  25 mg Oral Q6H PRN Violeta Gelinas, MD      . docusate sodium (COLACE) capsule 100 mg  100 mg Oral BID Meuth, Brooke A, PA-C   100 mg at 07/21/17 0953  . enoxaparin (LOVENOX) injection 40 mg  40 mg Subcutaneous Q24H Meuth, Brooke A, PA-C   40 mg at 07/21/17 3244  . feeding supplement (BOOST / RESOURCE BREEZE) liquid 1 Container  1 Container Oral TID BM Focht, Jessica L, PA   1 Container at 07/20/17 2022  . hydrocortisone cream 1 %   Topical BID PRN Violeta Gelinas, MD      . HYDROmorphone (DILAUDID) injection 1 mg  1 mg Intravenous Q3H PRN Focht, Jessica L, PA   1 mg at 07/19/17 2046  . methocarbamol (ROBAXIN) tablet 1,000 mg  1,000 mg Oral TID Franne Forts,  PA-C   1,000 mg at 07/21/17 0953  . ondansetron (ZOFRAN-ODT) disintegrating tablet 4 mg  4 mg Oral Q6H PRN Haddix, Gillie Manners, MD       Or  . ondansetron (ZOFRAN) injection 4 mg  4 mg Intravenous Q6H PRN Haddix, Gillie Manners, MD      . oxyCODONE (Oxy IR/ROXICODONE) immediate release tablet 5-10 mg  5-10 mg Oral Q4H PRN Meuth, Brooke A, PA-C   10 mg at 07/21/17 0953  . polyethylene glycol (MIRALAX / GLYCOLAX) packet 17 g  17 g Oral Daily Meuth, Brooke A, PA-C   17 g at 07/21/17 1610  . simethicone (MYLICON) chewable tablet 80 mg  80 mg Oral Q6H PRN Gaynelle Adu, MD   80 mg at 07/19/17 1418  . tamsulosin (FLOMAX) capsule 0.4 mg  0.4  mg Oral Daily Axel Filler, MD   0.4 mg at 07/21/17 9604  . traMADol (ULTRAM) tablet 50 mg  50 mg Oral Q6H Focht, Jessica L, PA   50 mg at 07/20/17 1749     Discharge Medications: Please see discharge summary for a list of discharge medications.  Relevant Imaging Results:  Relevant Lab Results:   Additional Information [id number] [place], [place]
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
## 2017-07-22 NOTE — Progress Notes (Signed)
CSW spoke with patient concerning SNF placement.  Patient has chosen [facility]  CSW to start auth.   Budd Palmer LCSWA [phone]
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
## 2017-07-23 MED ORDER — TRAMADOL HCL 50 MG PO TABS: 50.0000 mg | ORAL_TABLET | Freq: Four times a day (QID) | ORAL | 0 refills | Status: DC | PRN

## 2017-07-23 MED ORDER — DIPHENHYDRAMINE HCL 25 MG PO CAPS: 25.0000 mg | ORAL_CAPSULE | Freq: Four times a day (QID) | ORAL | 0 refills | Status: AC | PRN

## 2017-07-23 MED ORDER — BETHANECHOL CHLORIDE 10 MG PO TABS: 10.0000 mg | ORAL_TABLET | Freq: Three times a day (TID) | ORAL | Status: DC

## 2017-07-23 MED ORDER — DOCUSATE SODIUM 100 MG PO CAPS: 100.0000 mg | ORAL_CAPSULE | Freq: Two times a day (BID) | ORAL | 0 refills | Status: DC

## 2017-07-23 MED ORDER — TAMSULOSIN HCL 0.4 MG PO CAPS: 0.4000 mg | ORAL_CAPSULE | Freq: Every day | ORAL | Status: DC

## 2017-07-23 MED ORDER — HYDROCORTISONE 1 % EX CREA: TOPICAL_CREAM | Freq: Two times a day (BID) | CUTANEOUS | 0 refills | Status: DC | PRN

## 2017-07-23 MED ORDER — ACETAMINOPHEN 325 MG PO TABS: 650.0000 mg | ORAL_TABLET | Freq: Four times a day (QID) | ORAL | Status: AC

## 2017-07-23 MED ORDER — POLYETHYLENE GLYCOL 3350 17 G PO PACK: 17.0000 g | PACK | Freq: Every day | ORAL | 0 refills | Status: DC

## 2017-07-23 MED ORDER — ENOXAPARIN SODIUM 40 MG/0.4ML ~~LOC~~ SOLN: 40.0000 mg | SUBCUTANEOUS | 0 refills | Status: DC

## 2017-07-23 MED ORDER — METHOCARBAMOL 500 MG PO TABS: 1000.0000 mg | ORAL_TABLET | Freq: Three times a day (TID) | ORAL | 0 refills | Status: DC

## 2017-07-23 MED ORDER — OXYCODONE HCL 5 MG PO TABS: 5.0000 mg | ORAL_TABLET | Freq: Four times a day (QID) | ORAL | 0 refills | Status: DC | PRN
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
## 2017-07-23 NOTE — Clinical Social Work Placement (Signed)
   CLINICAL SOCIAL WORK PLACEMENT  NOTE  Date:  07/23/2017  Patient Details  Name: Jennell Janosik MRN: [id number] Date of Birth: 1986/01/30  Clinical Social Work is seeking post-discharge placement for this patient at the Skilled  Nursing Facility level of care (*CSW will initial, date and re-position this form in  chart as items are completed):  Yes   Patient/family provided with [facility] Clinical Social Work Department's list of facilities offering this level of care within the geographic area requested by the patient (or if unable, by the patient's family).  Yes   Patient/family informed of their freedom to choose among providers that offer the needed level of care, that participate in Medicare, Medicaid or managed care program needed by the patient, have an available bed and are willing to accept the patient.  Yes   Patient/family informed of [facility]'s ownership interest in [facility] and [facility], as well as of the fact that they are under no obligation to receive care at these facilities.  PASRR submitted to EDS on       PASRR number received on 07/21/17     Existing PASRR number confirmed on       FL2 transmitted to all facilities in geographic area requested by pt/family on 07/21/17     FL2 transmitted to all facilities within larger geographic area on       Patient informed that his/her managed care company has contracts with or will negotiate with certain facilities, including the following:        Yes   Patient/family informed of bed offers received.  Patient chooses bed at [facility]     Physician recommends and patient chooses bed at      Patient to be transferred to [facility] on 07/23/17.  Patient to be transferred to facility by [facility]     Patient family notified on 07/23/17 of transfer.  Name of family member notified:  mother, Terri     PHYSICIAN Please prepare priority discharge summary,  including medications     Additional Comment:   Macario Golds, LCSW [phone]
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
## 2017-07-23 NOTE — Clinical Social Work Note (Signed)
Clinical Social Worker facilitated patient discharge including contacting patient family and facility to confirm patient discharge plans.  Clinical information faxed to facility and family agreeable with plan.  CSW arranged transport via patient family to [facility].  RN to call report prior to discharge.  Clinical Social Worker will sign off for now as social work intervention is no longer needed. Please consult [place] again if new need arises.  Macario Golds, [place] [phone]

## 2017-07-23 NOTE — Clinical Social Work Placement (Signed)
   CLINICAL SOCIAL WORK PLACEMENT  NOTE  Date:  07/23/2017  Patient Details  Name: Lynn Scott MRN: [id number] Date of Birth: [birth year]  Clinical Social Work is seeking post-discharge placement for this patient at the Skilled  Nursing Facility level of care (*CSW will initial, date and re-position this form in  chart as items are completed):  Yes   Patient/family provided with [facility] Clinical Social Work Department's list of facilities offering this level of care within the geographic area requested by the patient (or if unable, by the patient's family).  Yes   Patient/family informed of their freedom to choose among providers that offer the needed level of care, that participate in Medicare, Medicaid or managed care program needed by the patient, have an available bed and are willing to accept the patient.  Yes   Patient/family informed of [facility]'s ownership interest in [facility] and [facility], as well as of the fact that they are under no obligation to receive care at these facilities.  PASRR submitted to EDS on       PASRR number received on 07/21/17     Existing PASRR number confirmed on       FL2 transmitted to all facilities in geographic area requested by pt/family on 07/21/17     FL2 transmitted to all facilities within larger geographic area on       Patient informed that his/her managed care company has contracts with or will negotiate with certain facilities, including the following:        Yes   Patient/family informed of bed offers received.  Patient chooses bed at [facility]     Physician recommends and patient chooses bed at      Patient to be transferred to [facility] on 07/23/17.  Patient to be transferred to facility by [facility]     Patient family notified on 07/23/17 of transfer.  Name of family member notified:  Family at bedside     PHYSICIAN Please prepare priority discharge  summary, including medications     Additional Comment:   Macario Golds, LCSW [phone]

## 2019-06-23 IMAGING — RF DG WRIST COMPLETE 3+V*L*
1 series · 6 of 6 positions shown · non-contrast
Comparison: None.

CLINICAL DATA: ORIF left wrist

EXAM:
LEFT WRIST - COMPLETE 3+ VIEW

[Series 1: run · 6 of 6 slices shown]
[im 1/6]
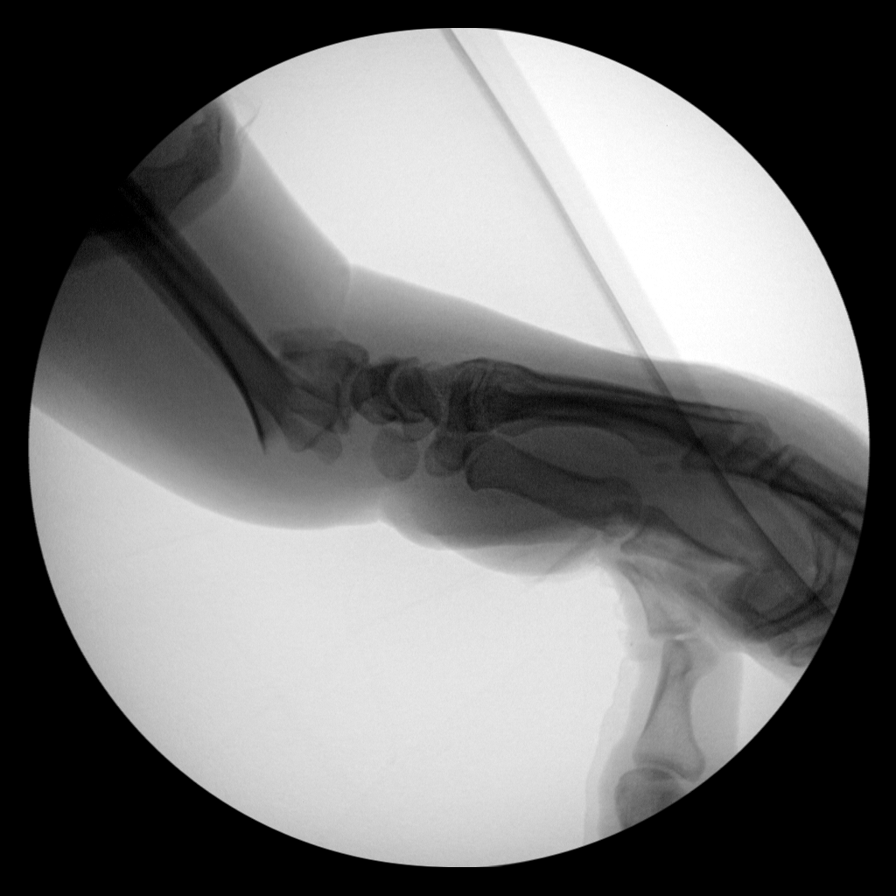
[im 2/6]
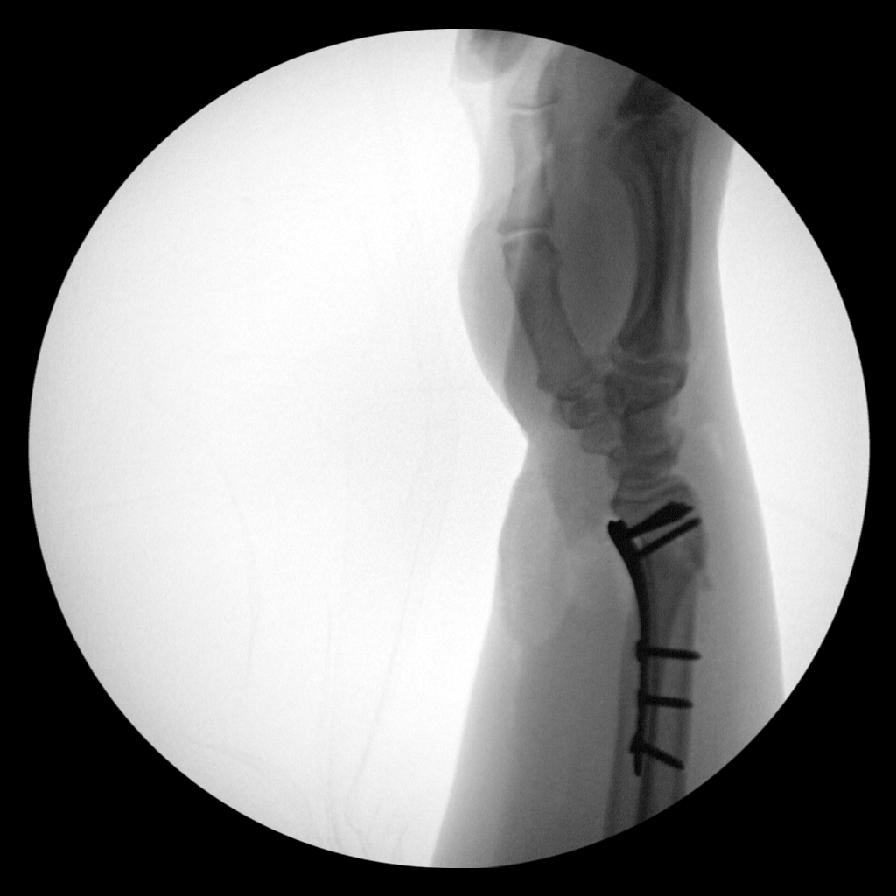
[im 3/6]
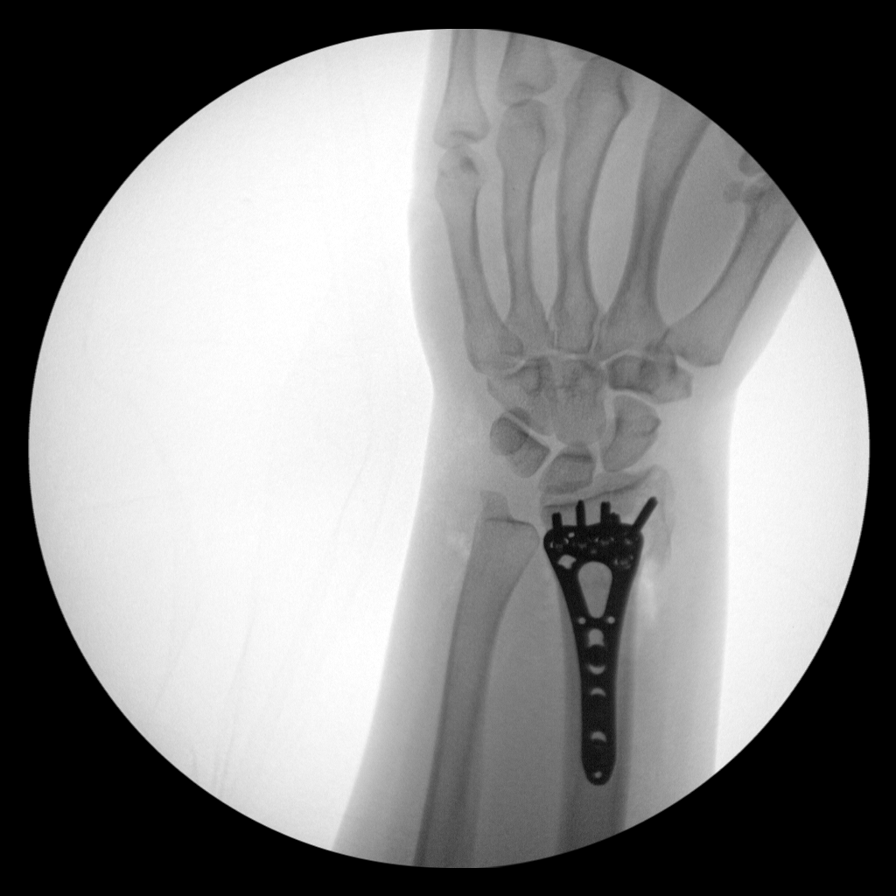
[im 4/6]
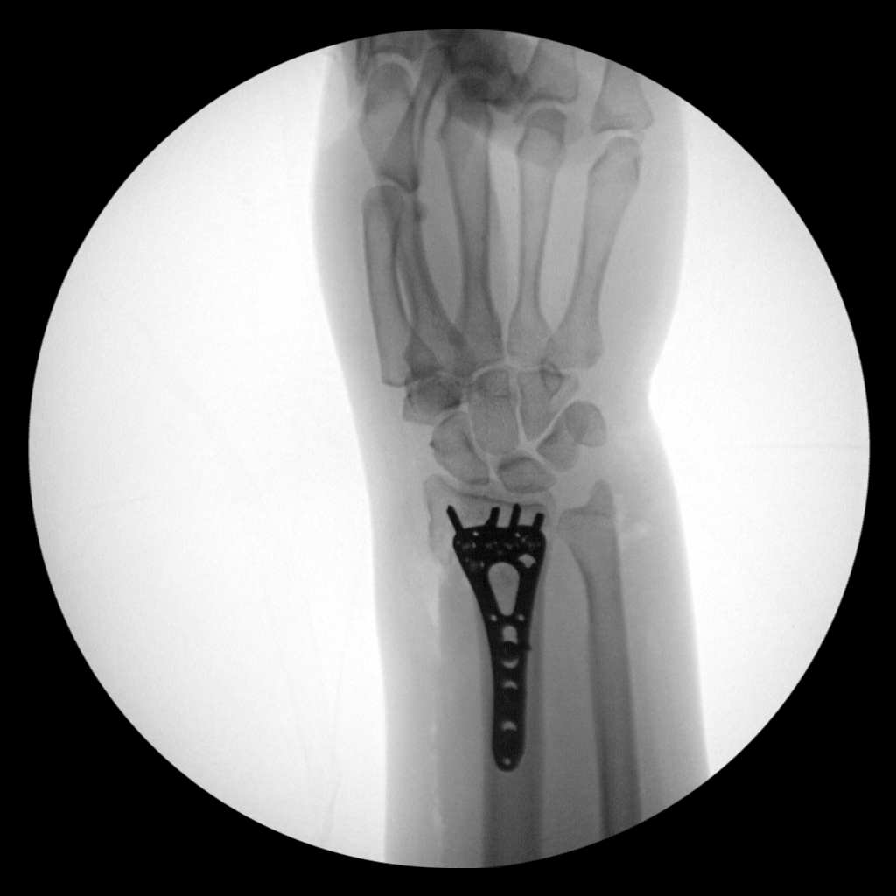
[im 5/6]
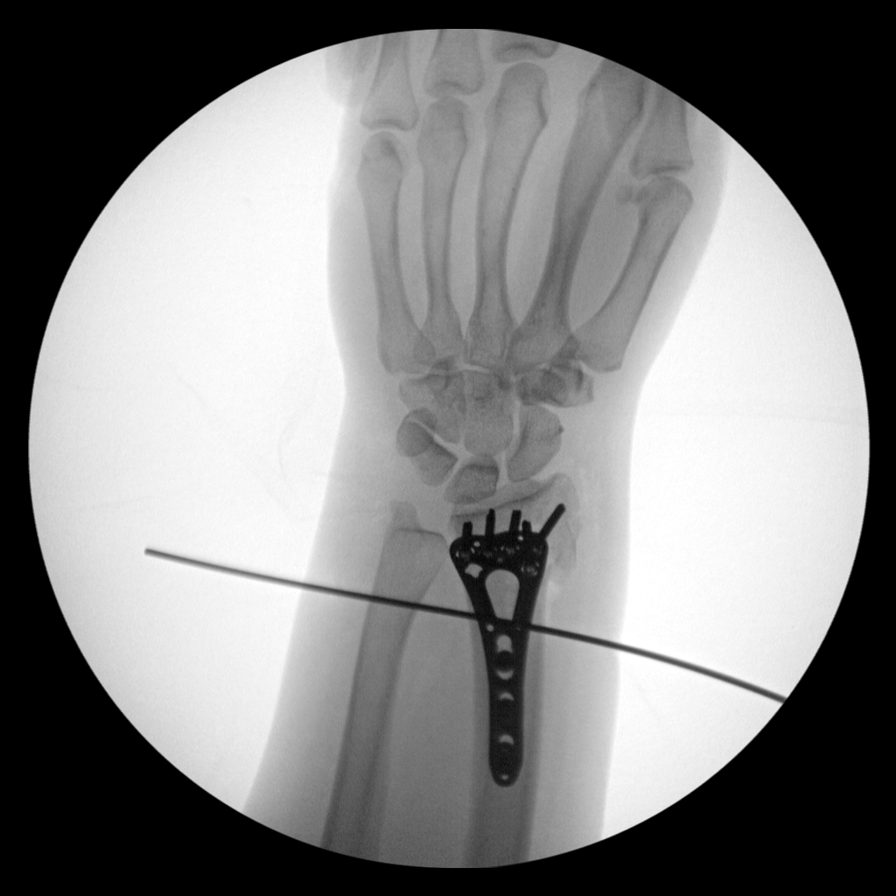
[im 6/6]
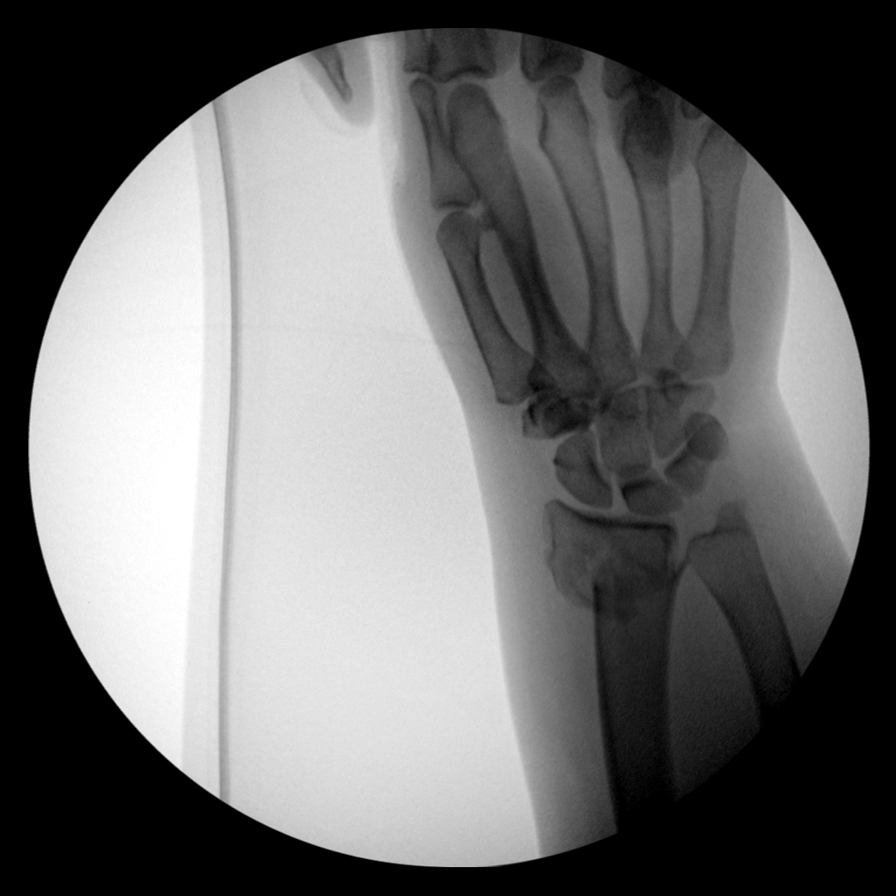

[6 of 6 positions shown; findings below may reference images not displayed]

FINDINGS: Multiple intraoperative spot images demonstrate plate and screw
fixation across the comminuted displaced distal left radial
fracture. Final image demonstrates anatomic alignment.
IMPRESSION: Internal fixation with anatomic alignment across the distal left
radial fracture.

## 2019-06-23 IMAGING — RF DG C-ARM 61-120 MIN
1 series · 9 of 9 positions shown · non-contrast
Comparison: 07/14/2017

CLINICAL DATA: ORIF of pubic symphysis and sacroiliac joints

EXAM:
JUDET PELVIS - 3+ VIEW; DG C-ARM 61-120 MIN

[Series 1: run · 9 of 9 slices shown]
[im 1/9]
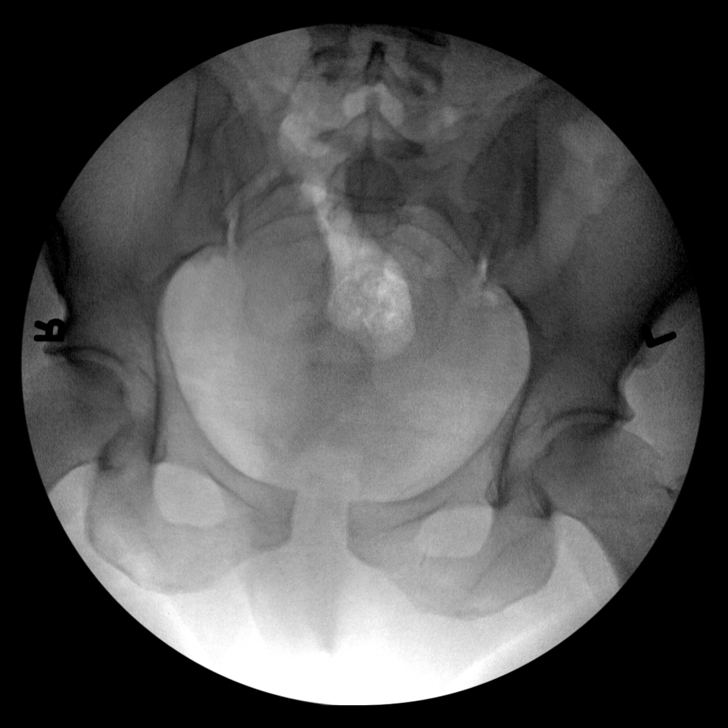
[im 2/9]
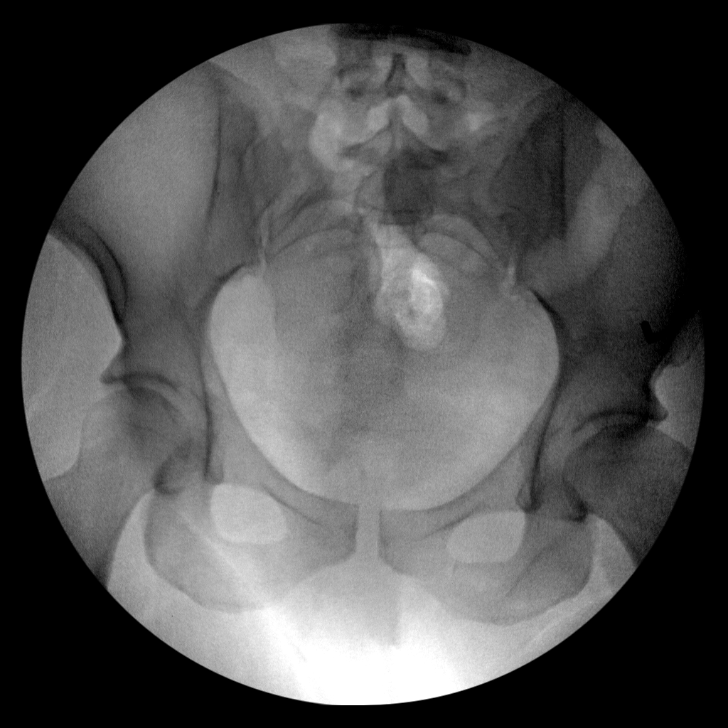
[im 3/9]
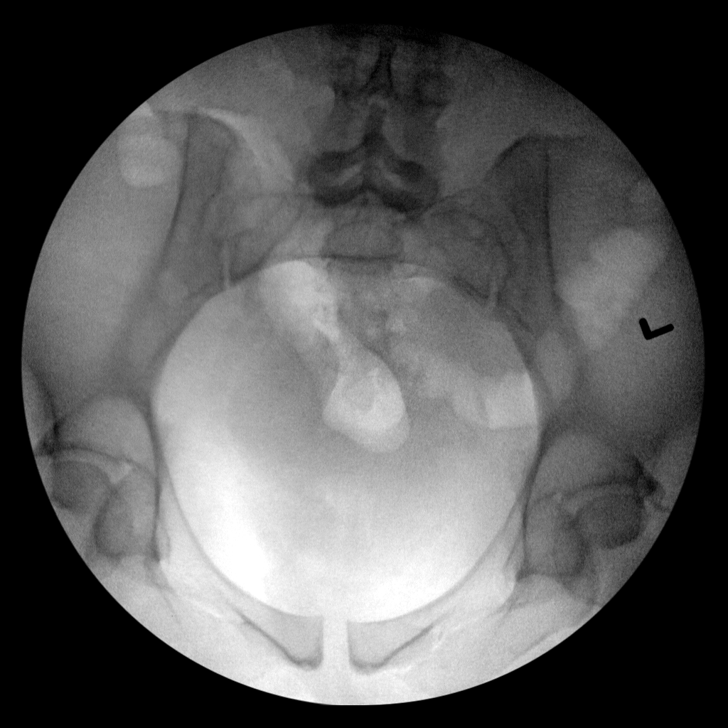
[im 4/9]
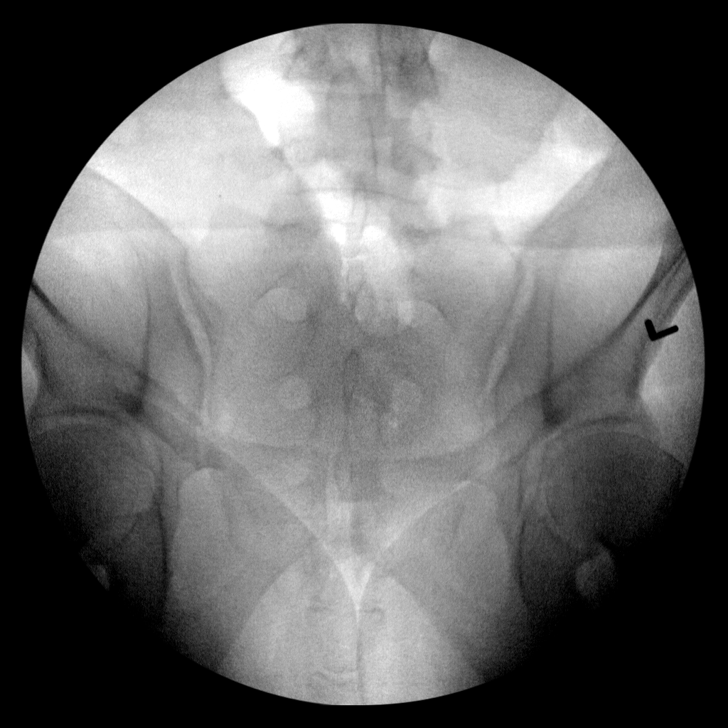
[im 5/9]
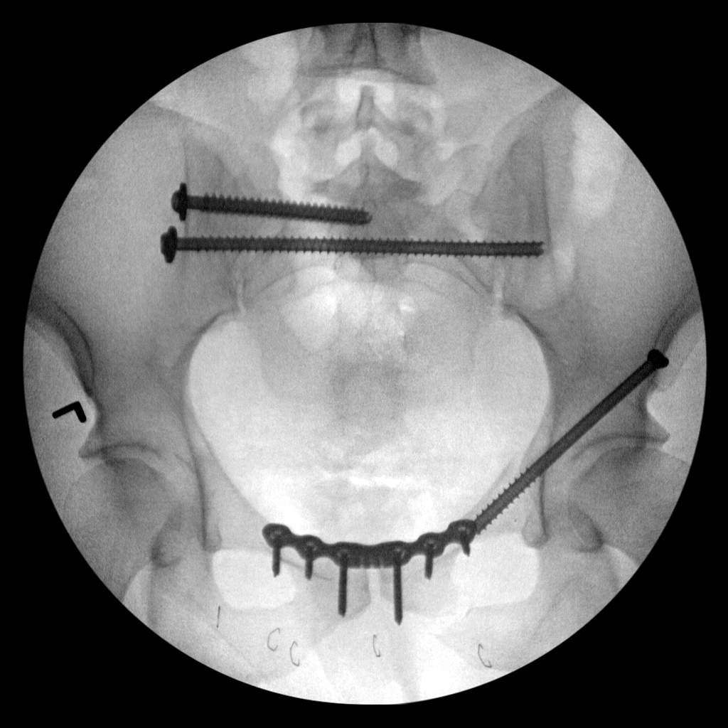
[im 6/9]
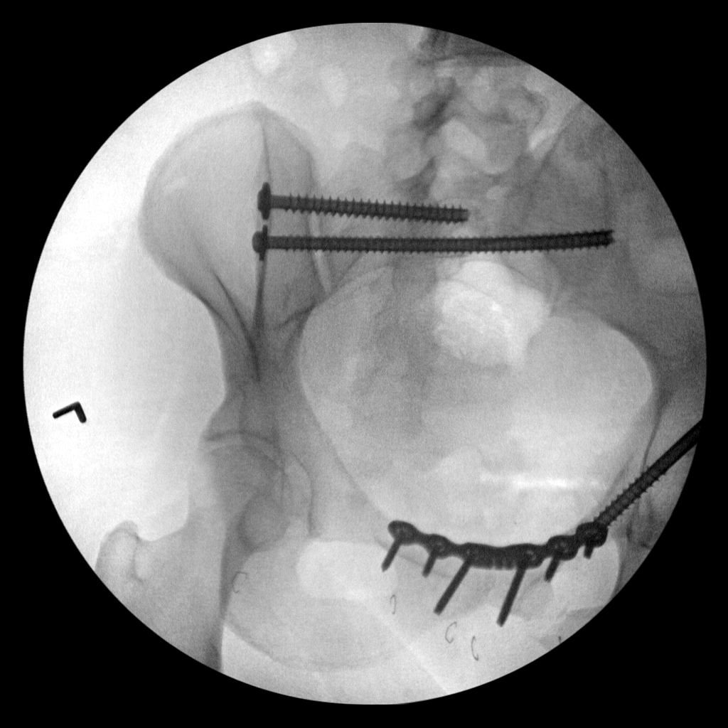
[im 7/9]
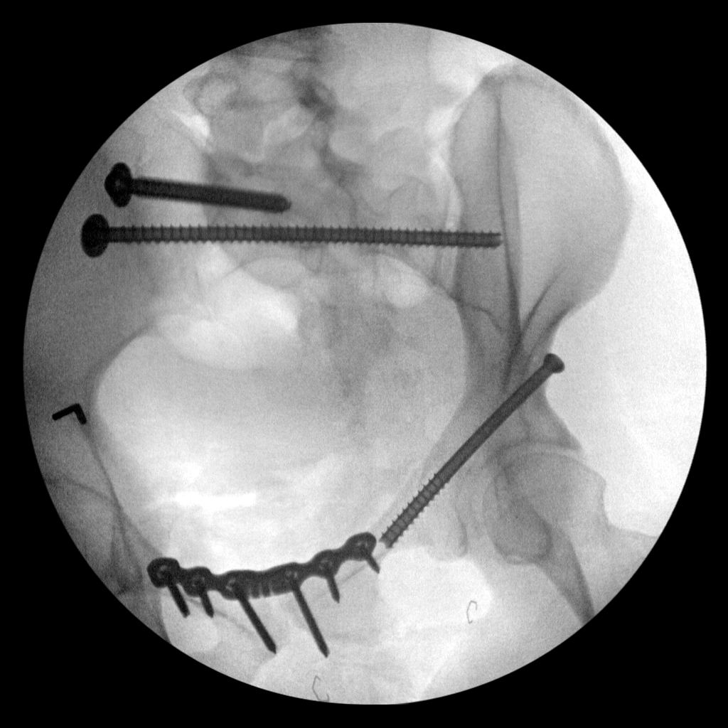
[im 8/9]
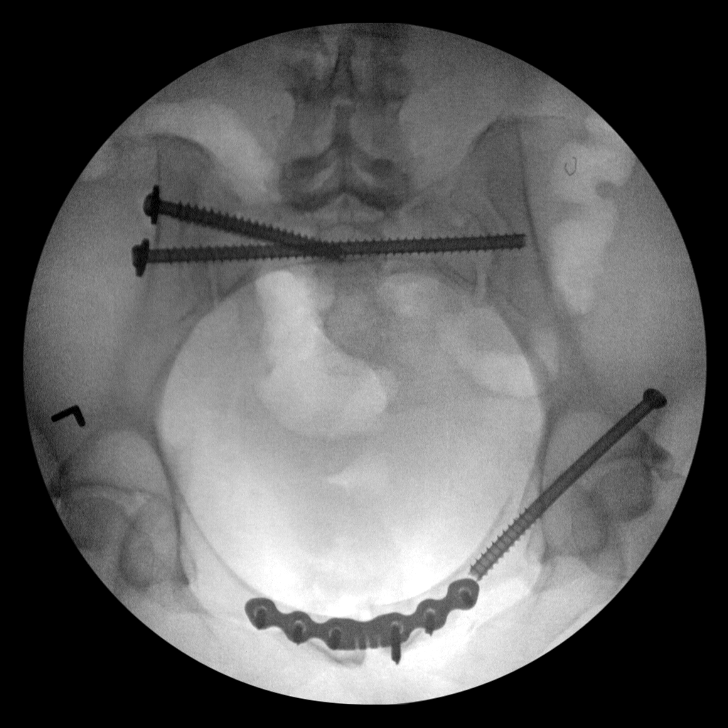
[im 9/9]
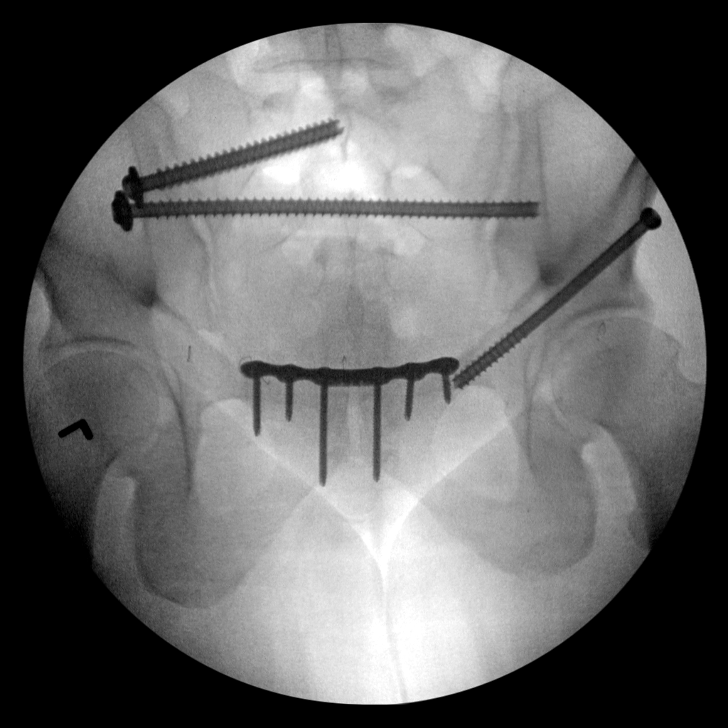

[9 of 9 positions shown; findings below may reference images not displayed]

FINDINGS: A total of 2 minutes 30 seconds of fluoroscopic time was utilized
for fixation of dye static pubic symphysis with plate and screws.
Demonstration of pubic rami fractures on the left are noted with
minimal displacement. Two fixation screws traverse the right
sacroiliac joint with the larger more caudal screw projecting across
the left SI joint as well.
IMPRESSION: Fluoroscopic time utilized for ORIF of sacroiliac and pubic
symphysis diastasis as above. Minimally displaced left acute
superior and inferior pubic rami fractures are noted.

## 2019-06-23 IMAGING — CT CT PELVIS W/O CM
2 of 5 series · 14 of 46 positions shown, 16 images · non-contrast
Comparison: Intraoperative fluoroscopic images of the pelvis status
post fixation of pelvic fractures from earlier on the same day.
Preoperative CT from 07/14/2017.

ADDENDUM:
Cannulated screw fixation across the left superior pubic ramus
fracture is noted as well.
CLINICAL DATA: ORIF of pelvic fractures.  Follow-up.  Pelvic pain.

EXAM:
CT PELVIS WITHOUT CONTRAST
TECHNIQUE: Multidetector CT imaging of the pelvis was performed following the
standard protocol without intravenous contrast.

[Series 5: soft tissue · axial · 0.84mm/px · z∈[+1031,+1253]mm · 11 of 129 slices shown, 13 images]
[im 9/129  soft-tissue]
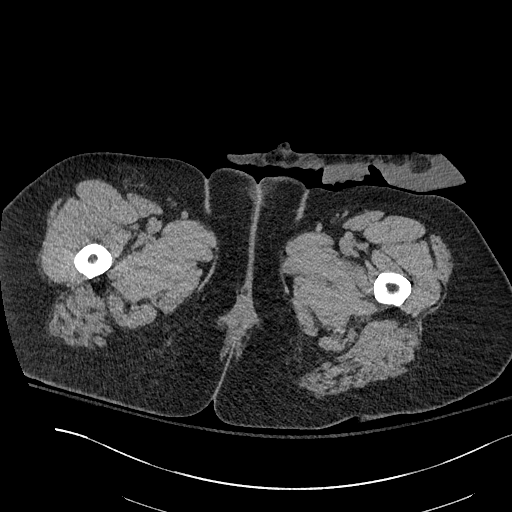
[im 9/129  bone]
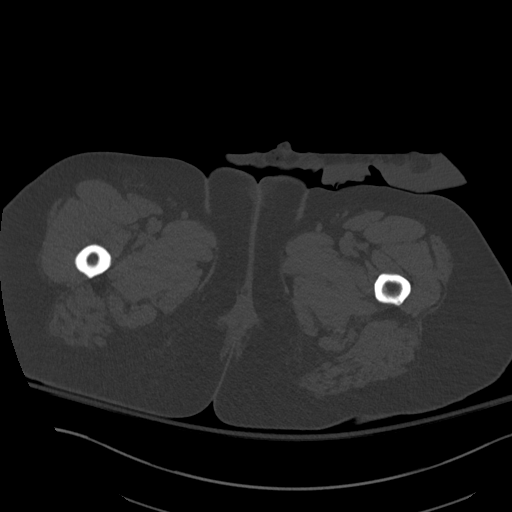
[im 21/129  soft-tissue]
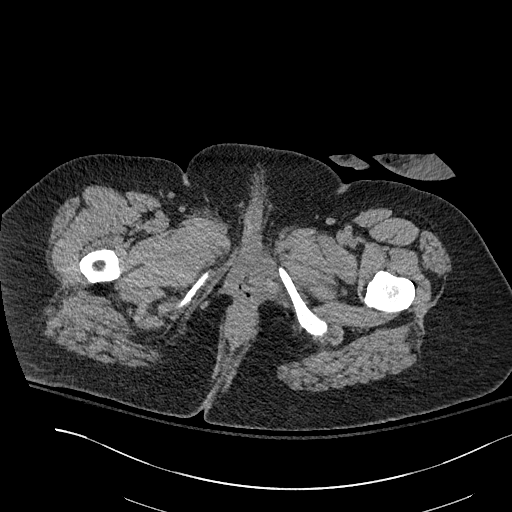
[im 29/129  soft-tissue]
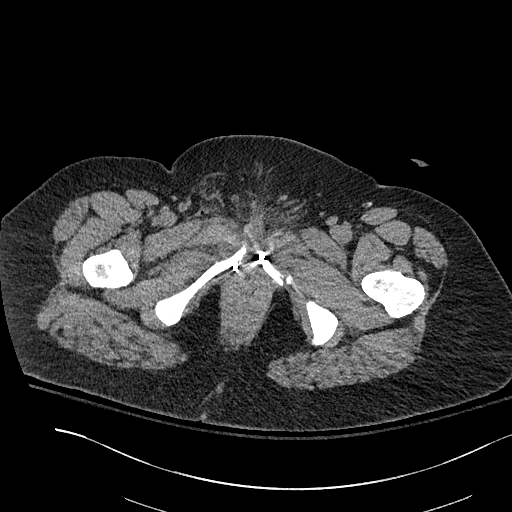
[im 42/129  soft-tissue]
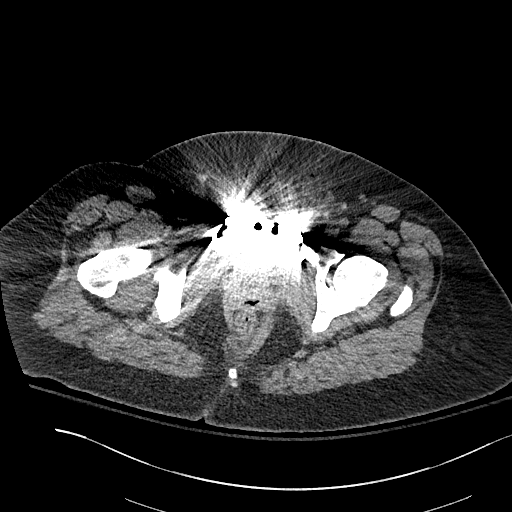
[im 54/129  soft-tissue]
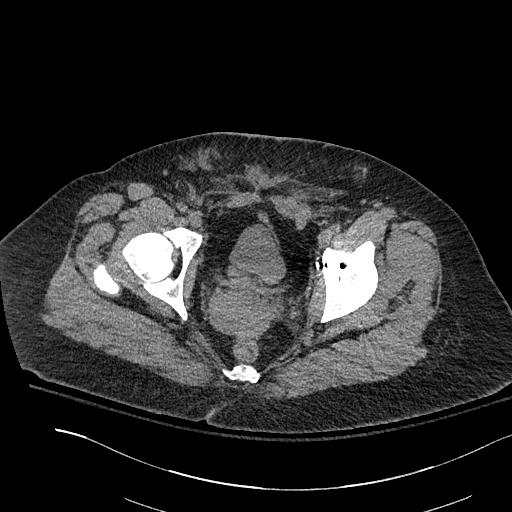
[im 67/129  soft-tissue]
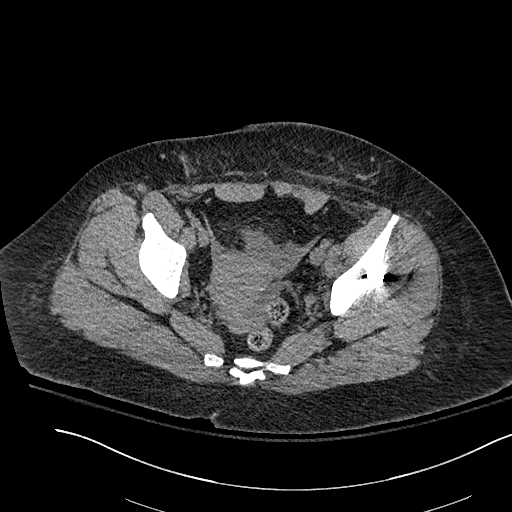
[im 75/129  soft-tissue]
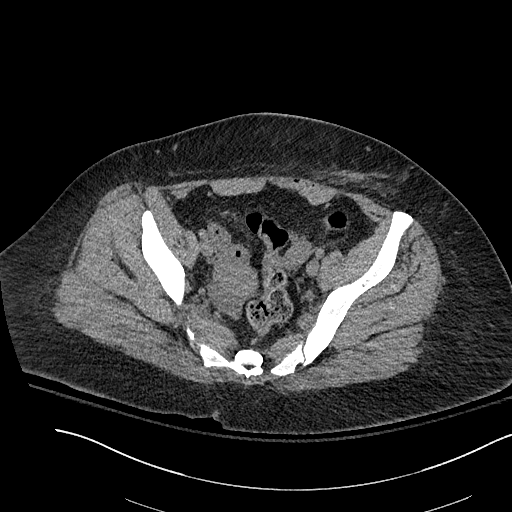
[im 87/129  soft-tissue]
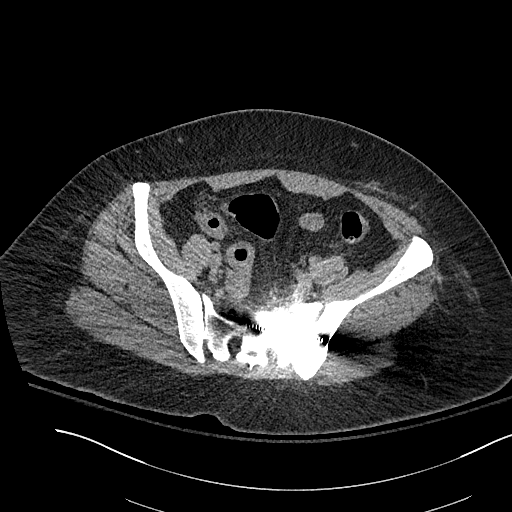
[im 100/129  soft-tissue]
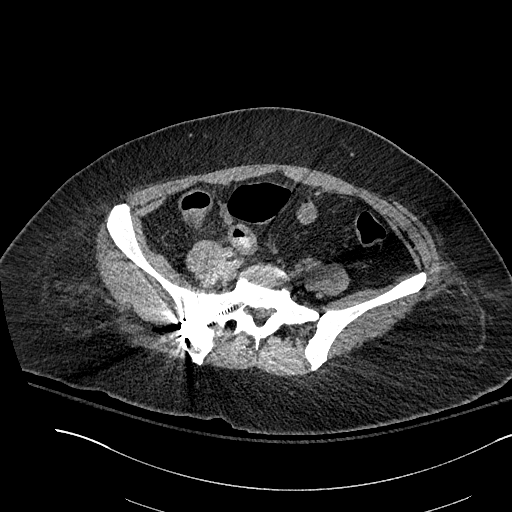
[im 100/129  bone]
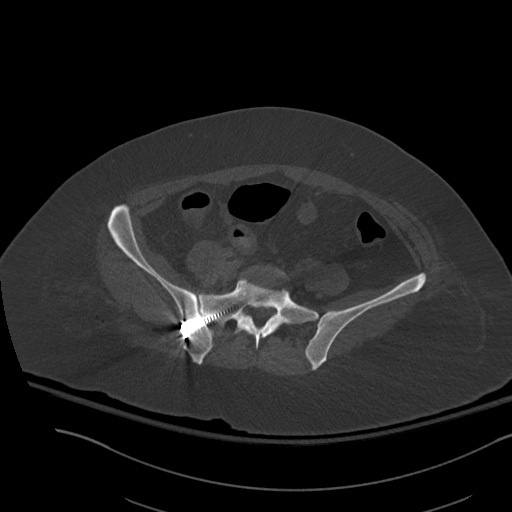
[im 108/129  soft-tissue]
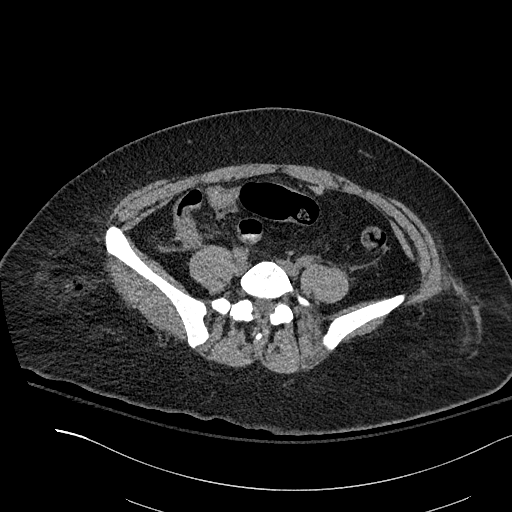
[im 120/129  soft-tissue]
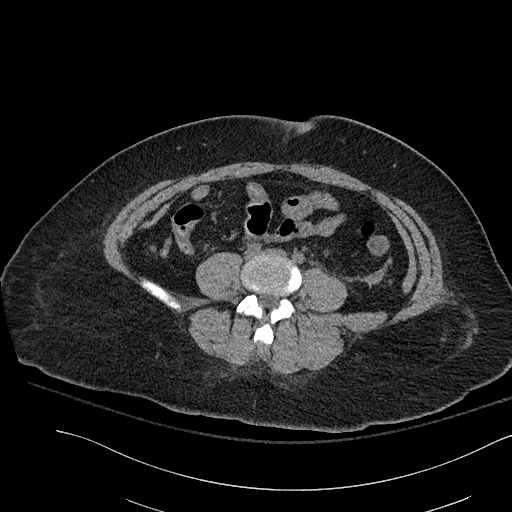

[Series 6: cor soft · coronal · 0.55mm/px · 3 of 154 slices shown]
[im 39/154  soft-tissue]
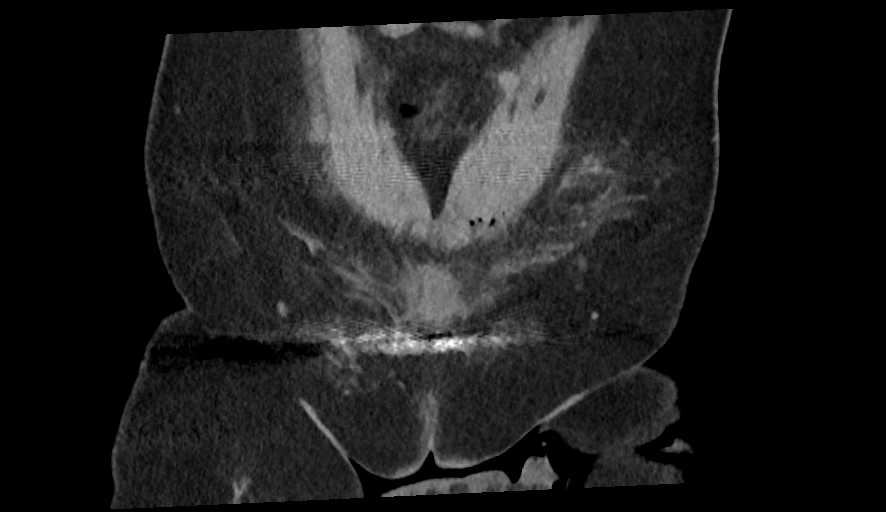
[im 77/154  soft-tissue]
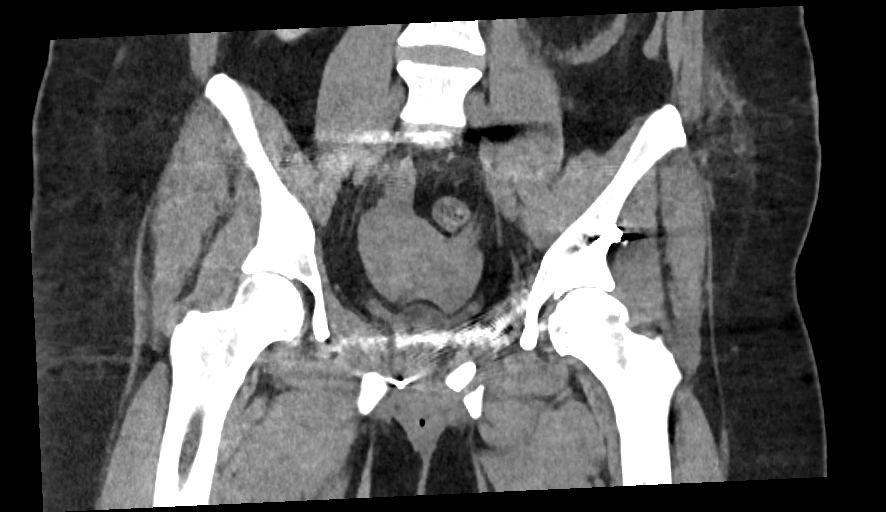
[im 115/154  soft-tissue]
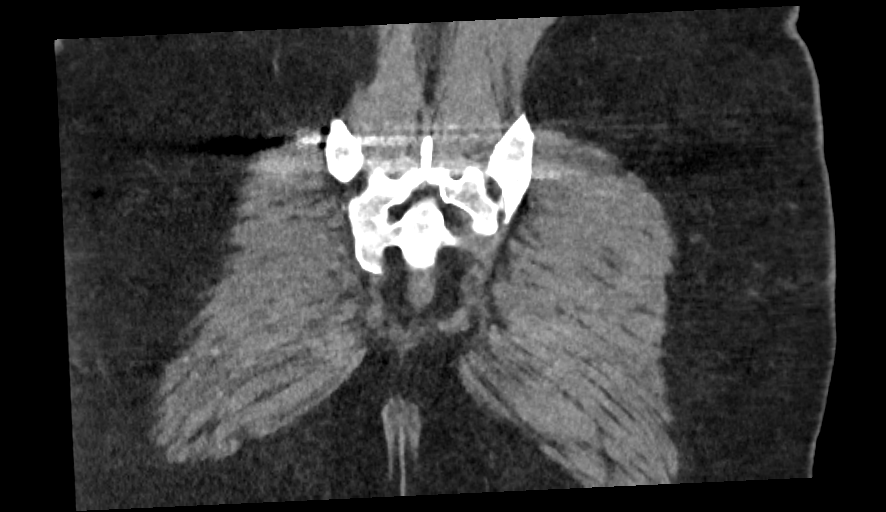

[14 of 46 positions shown; findings below may reference images not displayed]

FINDINGS: Urinary Tract: No hydroureteronephrosis. Decompressed urinary
bladder without mural hematoma. No extravasation.

Bowel: Decompressed small bowel in the right hemipelvis believed to
account for the slight thickened appearance due to underdistention.
Inflammation believed less likely. No bowel obstruction is seen.

Vascular/Lymphatic: The unopacified common iliac arteries and branch
vessels are unremarkable and symmetric in appearance.

Reproductive:  The uterus and adnexa are normal.

Other: Small amount of hyperdense free fluid in the pelvis without
active hemorrhage. Findings likely represent postop change.

Musculoskeletal: There are 2 transverse fixation screws across the
right SI joint with the larger more caudal screw traversing both SI
joints. No definite sacral foraminal involvement given limitations
due to streak artifacts from the screws. Plate and screw fixation
across the previously diastatic pubic symphysis is identified with
minimally displaced known superior and inferior left pubic rami
fractures. Hip joints are maintained. No soft tissue hematoma.
Postop soft tissue induration and soft tissue emphysema involving
the mons pubis.
IMPRESSION: 1. Fixation hardware across the symphysis and SI joints without
immediate postoperative complications noted.
2. Minimally displaced left superior and inferior pubic rami
fractures without complications identified.
3. Postop change involving the subcutaneous soft tissues of the mons
pubis with trace postop fluid in the cul-de-sac.

## 2019-06-27 ENCOUNTER — Other Ambulatory Visit: Payer: Self-pay | Admitting: Nurse Practitioner

## 2019-06-27 DIAGNOSIS — N926 Irregular menstruation, unspecified: Secondary | ICD-10-CM

## 2019-07-05 ENCOUNTER — Inpatient Hospital Stay: Admission: RE | Admit: 2019-07-05 | Payer: 59 | Source: Ambulatory Visit

## 2022-12-26 DIAGNOSIS — H9011 Conductive hearing loss, unilateral, right ear, with unrestricted hearing on the contralateral side: Secondary | ICD-10-CM | POA: Insufficient documentation

## 2022-12-26 DIAGNOSIS — H9313 Tinnitus, bilateral: Secondary | ICD-10-CM | POA: Insufficient documentation

## 2022-12-26 DIAGNOSIS — H9311 Tinnitus, right ear: Secondary | ICD-10-CM | POA: Insufficient documentation

## 2023-04-25 DIAGNOSIS — K921 Melena: Secondary | ICD-10-CM | POA: Insufficient documentation

## 2023-08-03 ENCOUNTER — Other Ambulatory Visit: Payer: Self-pay | Admitting: Family Medicine

## 2023-08-03 DIAGNOSIS — Z1231 Encounter for screening mammogram for malignant neoplasm of breast: Secondary | ICD-10-CM

## 2023-08-05 ENCOUNTER — Ambulatory Visit
Admission: RE | Admit: 2023-08-05 | Discharge: 2023-08-05 | Disposition: A | Discharge status: Home / Self Care | Source: Ambulatory Visit | Attending: Family Medicine

## 2023-08-05 DIAGNOSIS — Z1231 Encounter for screening mammogram for malignant neoplasm of breast: Secondary | ICD-10-CM
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
## 2023-08-07 ENCOUNTER — Ambulatory Visit (INDEPENDENT_AMBULATORY_CARE_PROVIDER_SITE_OTHER): Admitting: Gastroenterology

## 2023-08-07 ENCOUNTER — Other Ambulatory Visit (INDEPENDENT_AMBULATORY_CARE_PROVIDER_SITE_OTHER)

## 2023-08-07 ENCOUNTER — Encounter: Payer: Self-pay | Admitting: Gastroenterology

## 2023-08-07 VITALS — BP 116/78 | HR 92 | Ht 64.0 in | Wt 229.5 lb

## 2023-08-07 DIAGNOSIS — K219 Gastro-esophageal reflux disease without esophagitis: Secondary | ICD-10-CM

## 2023-08-07 DIAGNOSIS — Z8379 Family history of other diseases of the digestive system: Secondary | ICD-10-CM | POA: Diagnosis not present

## 2023-08-07 DIAGNOSIS — R1011 Right upper quadrant pain: Secondary | ICD-10-CM

## 2023-08-07 DIAGNOSIS — K625 Hemorrhage of anus and rectum: Secondary | ICD-10-CM

## 2023-08-07 DIAGNOSIS — R195 Other fecal abnormalities: Secondary | ICD-10-CM

## 2023-08-07 DIAGNOSIS — R112 Nausea with vomiting, unspecified: Secondary | ICD-10-CM

## 2023-08-07 DIAGNOSIS — K649 Unspecified hemorrhoids: Secondary | ICD-10-CM

## 2023-08-07 DIAGNOSIS — R1013 Epigastric pain: Secondary | ICD-10-CM

## 2023-08-07 DIAGNOSIS — Z800 Family history of malignant neoplasm of digestive organs: Secondary | ICD-10-CM

## 2023-08-07 LAB — CBC WITH DIFFERENTIAL/PLATELET
Basophils Absolute: 0.1 10*3/uL (ref 0.0–0.1)
Basophils Relative: 0.9 % (ref 0.0–3.0)
Eosinophils Absolute: 0.3 10*3/uL (ref 0.0–0.7)
Eosinophils Relative: 2.6 % (ref 0.0–5.0)
HCT: 46.8 % — ABNORMAL HIGH (ref 36.0–46.0)
Hemoglobin: 15.7 g/dL — ABNORMAL HIGH (ref 12.0–15.0)
Lymphocytes Relative: 30.9 % (ref 12.0–46.0)
Lymphs Abs: 4 10*3/uL (ref 0.7–4.0)
MCHC: 33.5 g/dL (ref 30.0–36.0)
MCV: 92.9 fl (ref 78.0–100.0)
Monocytes Absolute: 0.7 10*3/uL (ref 0.1–1.0)
Monocytes Relative: 5.5 % (ref 3.0–12.0)
Neutro Abs: 7.7 10*3/uL (ref 1.4–7.7)
Neutrophils Relative %: 60.1 % (ref 43.0–77.0)
Platelets: 358 10*3/uL (ref 150.0–400.0)
RBC: 5.03 Mil/uL (ref 3.87–5.11)
RDW: 13.3 % (ref 11.5–15.5)
WBC: 12.8 10*3/uL — ABNORMAL HIGH (ref 4.0–10.5)

## 2023-08-07 LAB — C-REACTIVE PROTEIN: CRP: <1 mg/dL (ref 0.5–20.0)

## 2023-08-07 LAB — COMPREHENSIVE METABOLIC PANEL WITH GFR
ALT: 30 U/L (ref 0–35)
AST: 20 U/L (ref 0–37)
Albumin: 4.6 g/dL (ref 3.5–5.2)
Alkaline Phosphatase: 66 U/L (ref 39–117)
BUN: 15 mg/dL (ref 6–23)
CO2: 25 meq/L (ref 19–32)
Calcium: 9.4 mg/dL (ref 8.4–10.5)
Chloride: 105 meq/L (ref 96–112)
Creatinine, Ser: 0.82 mg/dL (ref 0.40–1.20)
GFR: 89.48 mL/min (ref >60.00)
Glucose, Bld: 93 mg/dL (ref 70–99)
Potassium: 4.2 meq/L (ref 3.5–5.1)
Sodium: 139 meq/L (ref 135–145)
Total Bilirubin: 0.3 mg/dL (ref 0.2–1.2)
Total Protein: 7.2 g/dL (ref 6.0–8.3)

## 2023-08-07 LAB — SEDIMENTATION RATE: Sed Rate: 17 mm/h (ref 0–20)

## 2023-08-07 MED ORDER — NA SULFATE-K SULFATE-MG SULF 17.5-3.13-1.6 GM/177ML PO SOLN: 1.0000 | ORAL | 0 refills | Status: DC

## 2023-08-07 NOTE — Patient Instructions (Addendum)
 Your provider has requested that you go to the basement level for lab work before leaving today. Press B on the elevator. The lab is located at the first door on the left as you exit the elevator.  Due to recent changes in healthcare laws, you may see the results of your imaging and laboratory studies on MyChart before your provider has had a chance to review them.  We understand that in some cases there may be results that are confusing or concerning to you. Not all laboratory results come back in the same time frame and the provider may be waiting for multiple results in order to interpret others.  Please give us  48 hours in order for your provider to thoroughly review all the results before contacting the office for clarification of your results.   You have been scheduled for a colonoscopy. Please follow written instructions given to you at your visit today.   If you use inhalers (even only as needed), please bring them with you on the day of your procedure.  DO NOT TAKE 7 DAYS PRIOR TO TEST- Trulicity (dulaglutide) Ozempic, Wegovy (semaglutide) Mounjaro (tirzepatide) Bydureon Bcise (exanatide extended release)  DO NOT TAKE 1 DAY PRIOR TO YOUR TEST Rybelsus (semaglutide) Adlyxin (lixisenatide) Victoza (liraglutide) Byetta (exanatide) ___________________________________________________________________________  I appreciate the opportunity to care for you. Valiant Gaul, PA

## 2023-08-07 NOTE — Progress Notes (Signed)
 Lynn Scott

## 2023-08-08 LAB — TSH: TSH: 1.67 u[IU]/mL (ref 0.35–5.50)

## 2023-08-10 ENCOUNTER — Ambulatory Visit: Payer: Self-pay | Admitting: Gastroenterology

## 2023-08-10 ENCOUNTER — Other Ambulatory Visit: Payer: Self-pay | Admitting: Family Medicine

## 2023-08-10 DIAGNOSIS — R928 Other abnormal and inconclusive findings on diagnostic imaging of breast: Secondary | ICD-10-CM

## 2023-08-21 ENCOUNTER — Ambulatory Visit
Admission: RE | Admit: 2023-08-21 | Discharge: 2023-08-21 | Disposition: A | Discharge status: Home / Self Care | Source: Ambulatory Visit | Attending: Family Medicine | Admitting: Family Medicine

## 2023-08-21 ENCOUNTER — Other Ambulatory Visit: Payer: Self-pay | Admitting: Family Medicine

## 2023-08-21 DIAGNOSIS — N6489 Other specified disorders of breast: Secondary | ICD-10-CM

## 2023-08-21 DIAGNOSIS — R928 Other abnormal and inconclusive findings on diagnostic imaging of breast: Secondary | ICD-10-CM

## 2023-08-25 ENCOUNTER — Ambulatory Visit (INDEPENDENT_AMBULATORY_CARE_PROVIDER_SITE_OTHER)

## 2023-08-25 VITALS — BP 110/60 | HR 78 | Temp 97.2°F | Resp 16 | Ht 64.0 in | Wt 231.0 lb

## 2023-08-25 DIAGNOSIS — F17200 Nicotine dependence, unspecified, uncomplicated: Secondary | ICD-10-CM | POA: Diagnosis not present

## 2023-08-25 DIAGNOSIS — E66812 Obesity, class 2: Secondary | ICD-10-CM | POA: Diagnosis not present

## 2023-08-25 DIAGNOSIS — Z6839 Body mass index (BMI) 39.0-39.9, adult: Secondary | ICD-10-CM

## 2023-08-25 DIAGNOSIS — K219 Gastro-esophageal reflux disease without esophagitis: Secondary | ICD-10-CM | POA: Insufficient documentation

## 2023-08-25 DIAGNOSIS — N6489 Other specified disorders of breast: Secondary | ICD-10-CM | POA: Insufficient documentation

## 2023-08-25 DIAGNOSIS — R7303 Prediabetes: Secondary | ICD-10-CM | POA: Insufficient documentation

## 2023-08-25 DIAGNOSIS — Z6838 Body mass index (BMI) 38.0-38.9, adult: Secondary | ICD-10-CM | POA: Insufficient documentation

## 2023-08-25 DIAGNOSIS — E785 Hyperlipidemia, unspecified: Secondary | ICD-10-CM | POA: Insufficient documentation

## 2023-08-25 MED ORDER — BUPROPION HCL ER (XL) 150 MG PO TB24: 150.0000 mg | ORAL_TABLET | Freq: Every day | ORAL | 0 refills | Status: DC

## 2023-08-25 NOTE — Patient Instructions (Signed)
  VISIT SUMMARY: During your visit, we discussed your rectal bleeding, GERD, chest pain, breast mass evaluation, hyperlipidemia, glycemic control, smoking cessation, and weight management. We have scheduled several important diagnostic tests and initiated a new medication to help with smoking cessation and weight management.  YOUR PLAN: RECTAL BLEEDING: You have been experiencing intermittent rectal bleeding, and we need to investigate further due to your family history of Crohn's disease and colon cancer. -Proceed with your scheduled colonoscopy on July 8th.  BREAST ASYMMETRY: A recent mammogram showed an asymmetry in your left breast, and we need to evaluate this further. -Proceed with your scheduled breast biopsy on July 2nd.  GASTROESOPHAGEAL REFLUX DISEASE (GERD): You have GERD with intermittent upper abdominal and chest pain, which we are currently managing with medication. -Proceed with your scheduled upper GI endoscopy.  OBESITY: Your BMI is 39, and you have expressed a desire to lose weight. We discussed lifestyle changes and medication options. -Initiate Wellbutrin for weight management and smoking cessation. -Encourage dietary modifications and increased physical activity. -Monitor your weight and lifestyle changes.  SMOKING CESSATION: You currently smoke about one pack per day and want to quit. We discussed using Wellbutrin to help you stop smoking. -Initiate Wellbutrin to aid in smoking cessation. -Encourage reduction in smoking and eventual cessation.  HYPERLIPIDEMIA: You have a history of high cholesterol and are currently taking Crestor. We discussed the importance of lifestyle changes to manage your cholesterol levels. -Continue taking Crestor 5 mg daily. -Encourage lifestyle modifications to manage cholesterol levels.  BORDERLINE DIABETES: Your previous A1c was 6.0, indicating borderline diabetes. We discussed the importance of lifestyle changes to prevent progression to  diabetes. -Encourage dietary modifications and increased physical activity to manage blood sugar levels.                      Contains text generated by [employer].                                 Contains text generated by [employer].

## 2023-08-25 NOTE — Assessment & Plan Note (Signed)
 BMI of 39. She expresses desire for weight loss. Discussed importance of lifestyle changes including dietary modifications and increased physical activity. Discussed potential pharmacological options for weight management, including Wellbutrin, which also aids in smoking cessation. Emphasized that lifestyle changes are primary, with medication as a secondary aid. Wellbutrin chosen due to its additional benefits for smoking cessation and potential weight loss. - Initiate Wellbutrin for weight management and smoking cessation. Sent wellbutrin XL 150 mg daily. Discussed side effects. Advised to give it 2-3 weeks to start working.  - Encourage dietary modifications and increased physical activity. - Monitor weight and lifestyle changes.
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
## 2023-08-25 NOTE — Assessment & Plan Note (Signed)
 Currently smokes approximately one pack per day. Acknowledges need to quit but finds it stressful to attempt quitting alone. Wellbutrin chosen to aid in smoking cessation due to its effectiveness in reducing cravings and its affordability compared to other options. - Initiate Wellbutrin to aid in smoking cessation. Sent Wellbutrin XL 150 mg daily. - Encourage reduction in smoking and eventual cessation.

## 2023-08-25 NOTE — Assessment & Plan Note (Signed)
 Posterior upper outer left breast asymmetry noted on recent mammogram. Scheduled for a biopsy on July 2nd to evaluate further. She is aware of the need for further evaluation and is prepared to address findings as they arise. - Proceed with scheduled breast biopsy on July 2nd.

## 2023-08-25 NOTE — Assessment & Plan Note (Signed)
 GERD with intermittent upper abdominal and chest pain. Symptoms managed with Pepcid and sucralfate as needed. No evidence of ulcer based on symptomatology. Scheduled for an upper GI endoscopy to further evaluate. Symptoms are more consistent with GERD rather than an ulcer, given the absence of severe abdominal pain, coffee ground vomiting, or melena. - Proceed with scheduled upper GI endoscopy and colonoscopy which are already scheduled for JULY 8th  Cutting down on her tobacco use hopefully will help with her GERD.

## 2023-08-25 NOTE — Assessment & Plan Note (Signed)
 Previous A1c was 6.0, indicating borderline diabetes. Discussed the importance of lifestyle changes to prevent progression to diabetes. Emphasized the need for dietary modifications and increased physical activity to manage blood sugar levels. - Encourage dietary modifications and increased physical activity to manage blood sugar levels.     Will recheck A1C at her next visit in mid August

## 2023-08-25 NOTE — Assessment & Plan Note (Signed)
 Currently on Crestor 5 mg daily. Previous cholesterol levels were within normal range. Discussed the importance of lifestyle changes in conjunction with medication to manage cholesterol levels. Emphasized that smoking cessation and weight management may improve cholesterol levels. - Continue Crestor 5 mg daily. - Encourage lifestyle modifications to manage cholesterol levels.

## 2023-08-26 ENCOUNTER — Inpatient Hospital Stay
Admission: RE | Admit: 2023-08-26 | Discharge: 2023-08-26 | Discharge status: Home / Self Care | Source: Ambulatory Visit | Attending: Family Medicine | Admitting: Family Medicine

## 2023-08-26 ENCOUNTER — Ambulatory Visit
Admission: RE | Admit: 2023-08-26 | Discharge: 2023-08-26 | Disposition: A | Discharge status: Home / Self Care | Source: Ambulatory Visit | Attending: Family Medicine | Admitting: Family Medicine

## 2023-08-26 DIAGNOSIS — N6489 Other specified disorders of breast: Secondary | ICD-10-CM

## 2023-08-26 DIAGNOSIS — R928 Other abnormal and inconclusive findings on diagnostic imaging of breast: Secondary | ICD-10-CM

## 2023-08-26 HISTORY — PX: BREAST BIOPSY: SHX20

## 2023-08-27 LAB — SURGICAL PATHOLOGY

## 2023-09-01 ENCOUNTER — Ambulatory Visit: Admitting: Internal Medicine

## 2023-09-01 ENCOUNTER — Encounter: Payer: Self-pay | Admitting: Internal Medicine

## 2023-09-01 VITALS — BP 118/63 | HR 72 | Temp 98.2°F | Resp 11 | Ht 64.0 in | Wt 229.0 lb

## 2023-09-01 DIAGNOSIS — K648 Other hemorrhoids: Secondary | ICD-10-CM

## 2023-09-01 DIAGNOSIS — Z800 Family history of malignant neoplasm of digestive organs: Secondary | ICD-10-CM

## 2023-09-01 DIAGNOSIS — D123 Benign neoplasm of transverse colon: Secondary | ICD-10-CM

## 2023-09-01 DIAGNOSIS — K625 Hemorrhage of anus and rectum: Secondary | ICD-10-CM

## 2023-09-01 MED ORDER — SODIUM CHLORIDE 0.9 % IV SOLN: 500.0000 mL | INTRAVENOUS | Status: AC

## 2023-09-01 NOTE — Progress Notes (Unsigned)
 History and Physical Interval Note:  09/01/2023 4:02 PM  Lynn Scott  has presented today for endoscopic procedure(s), with the diagnosis of  Encounter Diagnoses  Name Primary?   Rectal bleeding Yes   Family history of colon cancer   .  The various methods of evaluation and treatment have been discussed with the patient and/or family. After consideration of risks, benefits and other options for treatment, the patient has consented to  the endoscopic procedure(s).  Unable to do EGD for reflux symptoms today as failed to schedule/obtain prior autorization.   The patient's history has been reviewed, patient examined, no change in status, stable for endoscopic procedure(s).  I have reviewed the patient's chart and labs.  Questions were answered to the patient's satisfaction.     Lupita CHARLENA Commander, MD, [facility]

## 2023-09-01 NOTE — Progress Notes (Unsigned)
 Pt's states no medical or surgical changes since previsit or office visit.

## 2023-09-01 NOTE — Progress Notes (Unsigned)
 Called to room to assist during endoscopic procedure.  Patient ID and intended procedure confirmed with present staff. Received instructions for my participation in the procedure from the performing physician.

## 2023-09-01 NOTE — Op Note (Signed)
 [facility] Patient Name: Lynn Scott Procedure Date: 09/01/2023 4:07 PM MRN: [id number] Endoscopist: Lupita FORBES Commander , MD, [id number] Age: 38 Referring MD:  Date of Birth: 08/12/1985 Gender: Female Account #: [id number] Procedure:                Colonoscopy Indications:              Rectal bleeding Medicines:                Monitored Anesthesia Care Procedure:                Pre-Anesthesia Assessment:                           - Prior to the procedure, a History and Physical                            was performed, and patient medications and                            allergies were reviewed. The patient's tolerance of                            previous anesthesia was also reviewed. The risks                            and benefits of the procedure and the sedation                            options and risks were discussed with the patient.                            All questions were answered, and informed consent                            was obtained. Prior Anticoagulants: The patient has                            taken no anticoagulant or antiplatelet agents. ASA                            Grade Assessment: III - A patient with severe                            systemic disease. After reviewing the risks and                            benefits, the patient was deemed in satisfactory                            condition to undergo the procedure.                           After obtaining informed consent, the colonoscope  was passed under direct vision. Throughout the                            procedure, the patient's blood pressure, pulse, and                            oxygen saturations were monitored continuously. The                            Olympus Scope [id number] was introduced through the                            anus and advanced to the the cecum, identified by                            appendiceal orifice and ileocecal  valve. The                            colonoscopy was performed without difficulty. The                            patient tolerated the procedure well. The quality                            of the bowel preparation was good. The ileocecal                            valve, appendiceal orifice, and rectum were                            photographed. The bowel preparation used was SUPREP                            via split dose instruction. Scope In: 4:12:51 PM Scope Out: 4:24:28 PM Scope Withdrawal Time: 0 hours 8 minutes 36 seconds  Total Procedure Duration: 0 hours 11 minutes 37 seconds  Findings:                 The perianal and digital rectal examinations were                            normal.                           Two sessile polyps were found in the transverse                            colon. The polyps were diminutive in size. These                            polyps were removed with a cold snare. Resection                            and retrieval were complete. Verification of  patient identification for the specimen was done.                            Estimated blood loss was minimal.                           Internal hemorrhoids were found.                           The exam was otherwise without abnormality on                            direct and retroflexion views. Complications:            No immediate complications. Estimated Blood Loss:     Estimated blood loss was minimal. Impression:               - Two diminutive polyps in the transverse colon,                            removed with a cold snare. Resected and retrieved.                           - Internal hemorrhoids.                           - The examination was otherwise normal on direct                            and retroflexion views. Recommendation:           - Patient has a contact number available for                            emergencies. The signs and symptoms of  potential                            delayed complications were discussed with the                            patient. Return to normal activities tomorrow.                            Written discharge instructions were provided to the                            patient.                           - Resume previous diet.                           - Continue present medications.                           - Repeat colonoscopy is recommended. The  colonoscopy date will be determined after pathology                            results from today's exam become available for                            review.                           She will observe for recurrence of upper abdominal                            pain - an EGD was planned but not done due to                            failure to obtain pre-authorization. She is without                            symptoms now and wants to observe and she can                            follow-up as needed for that. Lupita FORBES Commander, MD 09/01/2023 4:33:19 PM This report has been signed electronically.

## 2023-09-01 NOTE — Patient Instructions (Addendum)
 I found and removed 2 small polyps and saw hemorrhoids.  I will let you know pathology results and when to have another routine colonoscopy by mail and/or My Chart.  Observe for more upper abdominal pain and return as needed for that.  I appreciate the opportunity to care for you. Lupita CHARLENA Commander, MD, [facility]   Discharge instructions given. Handouts on polyps and Hemorrhoids. Resume previous medications. YOU HAD AN ENDOSCOPIC PROCEDURE TODAY AT THE [facility]:   Refer to the procedure report that was given to you for any specific questions about what was found during the examination.  If the procedure report does not answer your questions, please call your gastroenterologist to clarify.  If you requested that your care partner not be given the details of your procedure findings, then the procedure report has been included in a sealed envelope for you to review at your convenience later.  YOU SHOULD EXPECT: Some feelings of bloating in the abdomen. Passage of more gas than usual.  Walking can help get rid of the air that was put into your GI tract during the procedure and reduce the bloating. If you had a lower endoscopy (such as a colonoscopy or flexible sigmoidoscopy) you may notice spotting of blood in your stool or on the toilet paper. If you underwent a bowel prep for your procedure, you may not have a normal bowel movement for a few days.  Please Note:  You might notice some irritation and congestion in your nose or some drainage.  This is from the oxygen used during your procedure.  There is no need for concern and it should clear up in a day or so.  SYMPTOMS TO REPORT IMMEDIATELY:  Following lower endoscopy (colonoscopy or flexible sigmoidoscopy):  Excessive amounts of blood in the stool  Significant tenderness or worsening of abdominal pains  Swelling of the abdomen that is new, acute  Fever of 100F or higher   For urgent or emergent issues, a gastroenterologist can be  reached at any hour by calling [phone]. Do not use MyChart messaging for urgent concerns.    DIET:  We do recommend a small meal at first, but then you may proceed to your regular diet.  Drink plenty of fluids but you should avoid alcoholic beverages for 24 hours.  ACTIVITY:  You should plan to take it easy for the rest of today and you should NOT DRIVE or use heavy machinery until tomorrow (because of the sedation medicines used during the test).    FOLLOW UP: Our staff will call the number listed on your records the next business day following your procedure.  We will call around 7:15- 8:00 am to check on you and address any questions or concerns that you may have regarding the information given to you following your procedure. If we do not reach you, we will leave a message.     If any biopsies were taken you will be contacted by phone or by letter within the next 1-3 weeks.  Please call us  at [phone] if you have not heard about the biopsies in 3 weeks.    SIGNATURES/CONFIDENTIALITY: You and/or your care partner have signed paperwork which will be entered into your electronic medical record.  These signatures attest to the fact that that the information above on your After Visit Summary has been reviewed and is understood.  Full responsibility of the confidentiality of this discharge information lies with you and/or your care-partner.

## 2023-09-01 NOTE — Progress Notes (Unsigned)
 To pacu, VSS. Report to [email or web address]

## 2023-09-02 ENCOUNTER — Telehealth: Payer: Self-pay | Admitting: *Deleted

## 2023-09-02 NOTE — Telephone Encounter (Signed)
 Attempted post procedure follow up call.  No answer - unable to LVM.

## 2023-09-04 LAB — SURGICAL PATHOLOGY

## 2023-09-06 ENCOUNTER — Ambulatory Visit: Payer: Self-pay | Admitting: Internal Medicine

## 2023-09-06 ENCOUNTER — Encounter: Payer: Self-pay | Admitting: Internal Medicine

## 2023-09-06 DIAGNOSIS — Z860101 Personal history of adenomatous and serrated colon polyps: Secondary | ICD-10-CM | POA: Insufficient documentation

## 2023-09-14 LAB — HM COLONOSCOPY

## 2023-10-14 ENCOUNTER — Ambulatory Visit

## 2023-10-14 VITALS — BP 108/82 | HR 95 | Temp 97.6°F | Ht 64.0 in | Wt 223.0 lb

## 2023-10-14 DIAGNOSIS — K921 Melena: Secondary | ICD-10-CM

## 2023-10-14 DIAGNOSIS — Z6839 Body mass index (BMI) 39.0-39.9, adult: Secondary | ICD-10-CM

## 2023-10-14 DIAGNOSIS — E66812 Obesity, class 2: Secondary | ICD-10-CM

## 2023-10-14 DIAGNOSIS — K219 Gastro-esophageal reflux disease without esophagitis: Secondary | ICD-10-CM | POA: Diagnosis not present

## 2023-10-14 DIAGNOSIS — F17200 Nicotine dependence, unspecified, uncomplicated: Secondary | ICD-10-CM

## 2023-10-14 DIAGNOSIS — E785 Hyperlipidemia, unspecified: Secondary | ICD-10-CM | POA: Diagnosis not present

## 2023-10-14 DIAGNOSIS — R5383 Other fatigue: Secondary | ICD-10-CM | POA: Insufficient documentation

## 2023-10-14 DIAGNOSIS — F331 Major depressive disorder, recurrent, moderate: Secondary | ICD-10-CM | POA: Insufficient documentation

## 2023-10-14 DIAGNOSIS — H9311 Tinnitus, right ear: Secondary | ICD-10-CM

## 2023-10-14 DIAGNOSIS — R7303 Prediabetes: Secondary | ICD-10-CM

## 2023-10-14 LAB — HEMOGLOBIN A1C
Est. average glucose Bld gHb Est-mCnc: 114 mg/dL
Hgb A1c MFr Bld: 5.6 % (ref 4.8–5.6)

## 2023-10-14 MED ORDER — SERTRALINE HCL 25 MG PO TABS: 25.0000 mg | ORAL_TABLET | Freq: Every day | ORAL | 3 refills | Status: DC
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
## 2023-10-14 NOTE — Assessment & Plan Note (Signed)
 Rectal bleeding due to internal hemorrhoid. Had recent colonoscopy which ruled out malignancy. REPEAT COLONOSCOPY RECOMMENDED IN 2032.  Intermittent rectal bleeding attributed to an internal hemorrhoid. Prefers dietary modifications over stool softeners. Not constipated on a daily basis. Hemorrhoid is internal and not causing constant symptoms. - Encourage dietary modifications with adequate fiber intake - Increase water intake - Consider fiber supplements like Metamucil

## 2023-10-14 NOTE — Assessment & Plan Note (Signed)
 Scored 19 on the depression scale, indicating a high level of depression. Reports fatigue, depression, and financial stress due to the current economy and work environment. Currently on Wellbutrin  XL 150 mg daily but has not engaged in therapy. Discussed potential benefits of adding sertraline  to improve mood and energy levels. Informed about potential side effects of sertraline , including decreased sexual interest, which she is not concerned about. Emphasized the importance of exercise as a non-pharmacological treatment for depression. - Prescribe sertraline  25 mg daily - Encourage regular exercise - Follow up in 2 months to assess response to sertraline  - Review blood work results when available

## 2023-10-14 NOTE — Assessment & Plan Note (Signed)
 Advised to add exercise to help with her mood and her weight. Recommend to eat healthy.,

## 2023-10-14 NOTE — Assessment & Plan Note (Signed)
 Previously had an A1c of 6.0% as of June 2025. No recent A1c test conducted at this facility. Advised on dietary modifications by a nutritionist at another facility but did not find it effective. - Repeat A1c test to assess current status - Encourage dietary modifications to manage blood sugar levels

## 2023-10-14 NOTE — Assessment & Plan Note (Signed)
 Nicotine dependence, tobacco Smokes approximately one pack of cigarettes per day. Has not noticed any effect of Wellbutrin  on smoking habits. - Encourage reduction in smoking

## 2023-10-14 NOTE — Patient Instructions (Signed)
  VISIT SUMMARY: During your visit, we discussed your ongoing symptoms of rectal bleeding, depression, and other health concerns. We reviewed your recent procedures and current medications, and we made adjustments to your treatment plan to better manage your symptoms.  YOUR PLAN: MAJOR DEPRESSIVE DISORDER: You are experiencing significant symptoms of depression, including fatigue and stress related to your work and financial situation. -Start taking sertraline  25 mg daily to help improve your mood and energy levels. -Continue taking Wellbutrin  XL 150 mg daily. -Engage in regular exercise as it can help with depression. -Follow up in 2 months to assess how you are responding to the new medication.  RECTAL BLEEDING DUE TO INTERNAL HEMORRHOID: Your rectal bleeding is likely due to an internal hemorrhoid, which is influenced by your diet. -Continue with dietary modifications to include adequate fiber intake. -Increase your water intake. -Consider using fiber supplements like Metamucil.  INTERMITTENT TINNITUS IN THE RIGHT EAR: You have intermittent tinnitus in your right ear, which may be due to eustachian tube dysfunction. -Continue using Flonase nasal spray as needed. -Consider using white noise to help manage the symptoms.  BORDERLINE DIABETES: Your previous A1c test showed a level of 6.0%, indicating borderline diabetes. -Repeat the A1c test to check your current status. -Continue with dietary modifications to help manage your blood sugar levels.  NICOTINE DEPENDENCE, TOBACCO: You smoke approximately one pack of cigarettes per day. -Try to reduce the number of cigarettes you smoke each day.                      Contains text generated by Abridge.                                 Contains text generated by Abridge.

## 2023-10-14 NOTE — Assessment & Plan Note (Signed)
 Intermittent tinnitus in the right ear Experiences intermittent tinnitus in the right ear, sometimes associated with muffled sounds and eustachian tube dysfunction. Flonase nasal spray has provided limited relief. White noise recommended but not effective. - Continue Flonase nasal spray as needed - Consider white noise to manage symptoms

## 2023-10-15 LAB — COMPREHENSIVE METABOLIC PANEL WITH GFR
ALT: 29 IU/L (ref 0–32)
AST: 15 IU/L (ref 0–40)
Albumin: 4.4 g/dL (ref 3.9–4.9)
Alkaline Phosphatase: 72 IU/L (ref 44–121)
BUN/Creatinine Ratio: 21 (ref 9–23)
BUN: 14 mg/dL (ref 6–24)
Bilirubin Total: 0.3 mg/dL (ref 0.0–1.2)
CO2: 22 mmol/L (ref 20–29)
Calcium: 10.2 mg/dL (ref 8.7–10.2)
Chloride: 101 mmol/L (ref 96–106)
Creatinine, Ser: 0.68 mg/dL (ref 0.57–1.00)
Globulin, Total: 2.7 g/dL (ref 1.5–4.5)
Glucose: 90 mg/dL (ref 70–99)
Potassium: 4.7 mmol/L (ref 3.5–5.2)
Sodium: 137 mmol/L (ref 134–144)
Total Protein: 7.1 g/dL (ref 6.0–8.5)
eGFR: 113 mL/min/1.73 (ref >59)

## 2023-10-15 LAB — TSH: TSH: 1.84 u[IU]/mL (ref 0.450–4.500)

## 2023-10-15 LAB — LIPID PANEL
Chol/HDL Ratio: 6.8 ratio — ABNORMAL HIGH (ref 0.0–4.4)
Cholesterol, Total: 219 mg/dL — ABNORMAL HIGH (ref 100–199)
HDL: 32 mg/dL — ABNORMAL LOW (ref >39)
LDL Chol Calc (NIH): 137 mg/dL — ABNORMAL HIGH (ref 0–99)
Triglycerides: 274 mg/dL — ABNORMAL HIGH (ref 0–149)
VLDL Cholesterol Cal: 50 mg/dL — ABNORMAL HIGH (ref 5–40)

## 2023-10-16 ENCOUNTER — Ambulatory Visit: Payer: Self-pay

## 2023-11-15 ENCOUNTER — Other Ambulatory Visit: Payer: Self-pay

## 2023-11-16 ENCOUNTER — Other Ambulatory Visit: Payer: Self-pay

## 2023-11-16 MED ORDER — ROSUVASTATIN CALCIUM 5 MG PO TABS: 5.0000 mg | ORAL_TABLET | Freq: Every day | ORAL | 1 refills | Status: DC

## 2023-12-09 ENCOUNTER — Ambulatory Visit (INDEPENDENT_AMBULATORY_CARE_PROVIDER_SITE_OTHER)

## 2023-12-09 VITALS — BP 100/70 | HR 90 | Temp 97.3°F | Resp 16 | Ht 64.0 in | Wt 223.0 lb

## 2023-12-09 DIAGNOSIS — F331 Major depressive disorder, recurrent, moderate: Secondary | ICD-10-CM

## 2023-12-09 DIAGNOSIS — E785 Hyperlipidemia, unspecified: Secondary | ICD-10-CM | POA: Diagnosis not present

## 2023-12-09 DIAGNOSIS — K219 Gastro-esophageal reflux disease without esophagitis: Secondary | ICD-10-CM

## 2023-12-09 DIAGNOSIS — F17200 Nicotine dependence, unspecified, uncomplicated: Secondary | ICD-10-CM

## 2023-12-09 DIAGNOSIS — E66812 Obesity, class 2: Secondary | ICD-10-CM | POA: Diagnosis not present

## 2023-12-09 DIAGNOSIS — Z6838 Body mass index (BMI) 38.0-38.9, adult: Secondary | ICD-10-CM

## 2023-12-09 DIAGNOSIS — K648 Other hemorrhoids: Secondary | ICD-10-CM | POA: Insufficient documentation

## 2023-12-09 DIAGNOSIS — H9313 Tinnitus, bilateral: Secondary | ICD-10-CM

## 2023-12-09 MED ORDER — SERTRALINE HCL 50 MG PO TABS: 50.0000 mg | ORAL_TABLET | Freq: Every day | ORAL | 2 refills | Status: AC

## 2023-12-09 MED ORDER — WEGOVY 0.25 MG/0.5ML ~~LOC~~ SOAJ: 0.2500 mg | SUBCUTANEOUS | 2 refills | Status: DC

## 2023-12-09 NOTE — Assessment & Plan Note (Signed)
 Nicotine dependence, current smoker Has reduced smoking from over a pack a day to half a pack a day. Reduction attributed to decreased stress at work. - Encourage continued reduction in smoking

## 2023-12-09 NOTE — Assessment & Plan Note (Addendum)
 Reports improvement in depressive symptoms with sertraline  25 mg added to Wellbutrin  regimen. No significant side effects. Discussed increasing sertraline  to 50 mg for better symptom control. - Increase sertraline  to 50 mg daily - Instruct to take two 25 mg tablets until new prescription is filled - Send updated prescription to pharmacy
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
## 2023-12-09 NOTE — Assessment & Plan Note (Signed)
 Reports bilateral tinnitus with popping and humming sounds, more pronounced on the left side. No fluid detected in ears. ENT consultation unsatisfactory. Symptoms frustrating but not life-threatening. - Continue use of Flonase - Advise against frequent use of decongestants - Consider use of background noise for symptom relief - denies any hearing loss or vertigo, ruling out possibility of meniere disease

## 2023-12-09 NOTE — Assessment & Plan Note (Signed)
 Continues to experience rectal bleeding attributed to internal hemorrhoids. Bleeding not severe enough for surgical intervention. Prefers to avoid surgery due to potential pain and recovery challenges. - Continue use of stool softeners - Consider referral to colorectal surgeon if symptoms worsen     Chronic pelvic pain secondary to prior pelvic fracture Chronic pelvic pain limits ability to engage in prolonged physical activity. Pain is a result of a previous pelvic fracture in 2019. - Encourage continuation of physical activity within pain tolerance - Consider strength training as an alternative to prolonged walking

## 2023-12-09 NOTE — Assessment & Plan Note (Addendum)
 On pepcid 20 mg twice daily and Carafate 1 g three times daily. Recommend to cut back on smoking, eat less acidic foods.

## 2023-12-09 NOTE — Assessment & Plan Note (Addendum)
 Patient has tried dietary and lifestyle modifications including calorie restriction, weight loss behavior modification and increased physical activity for more than 6 months and patient has not been able to lose 5% of her body weight. Pelvic pain limits prolonged physical activity. It is medically necessary for the patient to add pharmacotherapy to their obesity treatment plan since they have been unable to achieve clinically significant results with active enrollment and participation in lifestyle changes.  Patient will continue lifestyle modifications in addition to starting pharmacotherapy to treat their obesity, including reduced calorie diet, increased physical activity and behavior changes.  Patient's BMI is 38.28 prior to starting medication to treat her obesity, pharmacotherapy is recommended following AACE guidelines. My patient has no contraindications to [facility], including no personal or family history of medullary thyroid carcinoma and no personal or family history of multiple endocrine neoplasia syndrome type II.  Additionally my patient is not currently taking any other GLP-1 receptor agonist medications.  The patient will adhere to 1700 calories per day and 150-200 minutes of exercise weekly.  - Send wegovy 0.25 mg weekly prescription to [facility] for potential better insurance coverage

## 2023-12-09 NOTE — Assessment & Plan Note (Addendum)
 Cholesterol levels slightly elevated but managed with current medication CRESTOR  5 MG DAILY
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
## 2023-12-11 ENCOUNTER — Ambulatory Visit

## 2024-02-09 ENCOUNTER — Ambulatory Visit

## 2024-02-11 ENCOUNTER — Ambulatory Visit

## 2024-02-11 VITALS — BP 110/86 | HR 75 | Temp 98.3°F | Ht 64.0 in | Wt 210.8 lb

## 2024-02-11 DIAGNOSIS — K219 Gastro-esophageal reflux disease without esophagitis: Secondary | ICD-10-CM | POA: Diagnosis not present

## 2024-02-11 DIAGNOSIS — R7303 Prediabetes: Secondary | ICD-10-CM

## 2024-02-11 DIAGNOSIS — E66812 Obesity, class 2: Secondary | ICD-10-CM

## 2024-02-11 DIAGNOSIS — E785 Hyperlipidemia, unspecified: Secondary | ICD-10-CM

## 2024-02-11 LAB — POCT LIPID PANEL
HDL: <15
TC: <100
TRG: 201

## 2024-02-11 NOTE — Assessment & Plan Note (Signed)
 SABRA
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
## 2024-02-11 NOTE — Assessment & Plan Note (Signed)
 Lynn Scott

## 2024-02-11 NOTE — Assessment & Plan Note (Signed)
°  ° °

## 2024-02-12 NOTE — Assessment & Plan Note (Signed)
 with comorbidity of hyperlipidemia and borderline diabetes.  They have successfully lost 13 pounds since October through walking and mindful eating. Wegovy  was initially considered for weight loss but was not covered by insurance. Wellbutrin  and Zoloft  have aided in weight management and smoking cessation. Phentermine was considered but not recommended due to its stimulant properties, which could increase anxiety when combined with Wellbutrin . - Continue current weight management plan with walking and mindful eating - Encourage continued use of Wellbutrin  and Zoloft  - Advise against using phentermine due to potential anxiety - Encourage setting realistic weight loss goals, especially during the holidays

## 2024-02-28 ENCOUNTER — Other Ambulatory Visit: Payer: Self-pay

## 2024-02-28 MED ORDER — ROSUVASTATIN CALCIUM 5 MG PO TABS: 5.0000 mg | ORAL_TABLET | Freq: Every day | ORAL | 1 refills | Status: AC

## 2024-02-28 MED ORDER — BUPROPION HCL ER (XL) 150 MG PO TB24: 150.0000 mg | ORAL_TABLET | Freq: Every day | ORAL | 0 refills | Status: AC

## 2024-03-01 ENCOUNTER — Encounter (HOSPITAL_BASED_OUTPATIENT_CLINIC_OR_DEPARTMENT_OTHER): Payer: Self-pay | Admitting: Emergency Medicine

## 2024-03-01 ENCOUNTER — Ambulatory Visit (HOSPITAL_BASED_OUTPATIENT_CLINIC_OR_DEPARTMENT_OTHER)
Admission: EM | Admit: 2024-03-01 | Discharge: 2024-03-01 | Disposition: A | Discharge status: Home / Self Care | Attending: Family Medicine | Admitting: Family Medicine

## 2024-03-01 DIAGNOSIS — K011 Impacted teeth: Secondary | ICD-10-CM | POA: Diagnosis not present

## 2024-03-01 DIAGNOSIS — K047 Periapical abscess without sinus: Secondary | ICD-10-CM

## 2024-03-01 DIAGNOSIS — K0889 Other specified disorders of teeth and supporting structures: Secondary | ICD-10-CM | POA: Diagnosis not present

## 2024-03-01 MED ORDER — FLUCONAZOLE 150 MG PO TABS: ORAL_TABLET | ORAL | 0 refills | Status: AC

## 2024-03-01 MED ORDER — AMOXICILLIN-POT CLAVULANATE 875-125 MG PO TABS: 1.0000 | ORAL_TABLET | Freq: Two times a day (BID) | ORAL | 0 refills | Status: AC

## 2024-03-01 NOTE — ED Triage Notes (Signed)
 Pt went to dentist today for cleaning but since Friday she has been having discomfort. Pt noticed tonight that she has a bump with pus coming out of it when she was brushing her teeth.

## 2024-03-01 NOTE — Discharge Instructions (Addendum)
 Dental infection secondary to an impacted right lower molar and dental pain: Augmentin  875-125 mg twice daily for 10 days with food for the dental infection.  Fluconazole , 150 mg, 1 pill now and repeat in 5 to 7 days if needed for vaginal yeast infection secondary to antibiotic use.  Use acetaminophen  or ibuprofen if needed for dental pain  Follow-up with dentist regarding impacted molar.  Follow-up here if needed.
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
## 2024-06-16 ENCOUNTER — Ambulatory Visit
